# Patient Record
Sex: Female | Born: 1990
Health system: Southern US, Community
[De-identification: ages and names within clinical notes are randomized; demographics above are authoritative.]

## PROBLEM LIST (undated history)

## (undated) DIAGNOSIS — E282 Polycystic ovarian syndrome: Secondary | ICD-10-CM

---
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
## 2014-08-27 ENCOUNTER — Emergency Department (HOSPITAL_COMMUNITY): Payer: 59

## 2014-08-27 ENCOUNTER — Emergency Department (HOSPITAL_COMMUNITY)
Admission: EM | Admit: 2014-08-27 | Discharge: 2014-08-27 | Disposition: A | Discharge status: Home / Self Care | Payer: 59 | Attending: Emergency Medicine | Admitting: Emergency Medicine

## 2014-08-27 ENCOUNTER — Encounter (HOSPITAL_COMMUNITY): Payer: Self-pay | Admitting: *Deleted

## 2014-08-27 DIAGNOSIS — R630 Anorexia: Secondary | ICD-10-CM | POA: Insufficient documentation

## 2014-08-27 DIAGNOSIS — R1031 Right lower quadrant pain: Secondary | ICD-10-CM

## 2014-08-27 DIAGNOSIS — Z8639 Personal history of other endocrine, nutritional and metabolic disease: Secondary | ICD-10-CM | POA: Insufficient documentation

## 2014-08-27 DIAGNOSIS — K5900 Constipation, unspecified: Secondary | ICD-10-CM | POA: Insufficient documentation

## 2014-08-27 DIAGNOSIS — N898 Other specified noninflammatory disorders of vagina: Secondary | ICD-10-CM | POA: Diagnosis not present

## 2014-08-27 DIAGNOSIS — Z3202 Encounter for pregnancy test, result negative: Secondary | ICD-10-CM | POA: Insufficient documentation

## 2014-08-27 HISTORY — DX: Polycystic ovarian syndrome: E28.2

## 2014-08-27 LAB — LIPASE, BLOOD: LIPASE: 28 U/L (ref 11–59)

## 2014-08-27 LAB — COMPREHENSIVE METABOLIC PANEL
ALK PHOS: 53 U/L (ref 39–117)
ALT: 16 U/L (ref 0–35)
ANION GAP: 12 (ref 5–15)
AST: 19 U/L (ref 0–37)
Albumin: 4.3 g/dL (ref 3.5–5.2)
BILIRUBIN TOTAL: 0.6 mg/dL (ref 0.3–1.2)
BUN: 11 mg/dL (ref 6–23)
CHLORIDE: 100 meq/L (ref 96–112)
CO2: 25 meq/L (ref 19–32)
CREATININE: 0.74 mg/dL (ref 0.50–1.10)
Calcium: 9.4 mg/dL (ref 8.4–10.5)
GLUCOSE: 100 mg/dL — AB (ref 70–99)
POTASSIUM: 3.9 meq/L (ref 3.7–5.3)
Sodium: 137 mEq/L (ref 137–147)
Total Protein: 8.1 g/dL (ref 6.0–8.3)

## 2014-08-27 LAB — CBC WITH DIFFERENTIAL/PLATELET
BASOS PCT: 1 % (ref 0–1)
Basophils Absolute: 0.1 10*3/uL (ref 0.0–0.1)
Eosinophils Absolute: 0.1 10*3/uL (ref 0.0–0.7)
Eosinophils Relative: 1 % (ref 0–5)
HEMATOCRIT: 42.9 % (ref 36.0–46.0)
HEMOGLOBIN: 14.3 g/dL (ref 12.0–15.0)
LYMPHS ABS: 1.8 10*3/uL (ref 0.7–4.0)
Lymphocytes Relative: 25 % (ref 12–46)
MCH: 30.9 pg (ref 26.0–34.0)
MCHC: 33.3 g/dL (ref 30.0–36.0)
MCV: 92.7 fL (ref 78.0–100.0)
MONO ABS: 0.8 10*3/uL (ref 0.1–1.0)
MONOS PCT: 12 % (ref 3–12)
NEUTROS ABS: 4.5 10*3/uL (ref 1.7–7.7)
NEUTROS PCT: 61 % (ref 43–77)
Platelets: 232 10*3/uL (ref 150–400)
RBC: 4.63 MIL/uL (ref 3.87–5.11)
RDW: 12.8 % (ref 11.5–15.5)
WBC: 7.2 10*3/uL (ref 4.0–10.5)

## 2014-08-27 LAB — URINALYSIS, ROUTINE W REFLEX MICROSCOPIC
BILIRUBIN URINE: NEGATIVE
Glucose, UA: NEGATIVE mg/dL
Hgb urine dipstick: NEGATIVE
KETONES UR: NEGATIVE mg/dL
NITRITE: NEGATIVE
Protein, ur: NEGATIVE mg/dL
Specific Gravity, Urine: 1.012 (ref 1.005–1.030)
UROBILINOGEN UA: 0.2 mg/dL (ref 0.0–1.0)
pH: 7 (ref 5.0–8.0)

## 2014-08-27 LAB — WET PREP, GENITAL
Clue Cells Wet Prep HPF POC: NONE SEEN
Trich, Wet Prep: NONE SEEN
WBC, Wet Prep HPF POC: NONE SEEN
Yeast Wet Prep HPF POC: NONE SEEN

## 2014-08-27 LAB — URINE MICROSCOPIC-ADD ON

## 2014-08-27 LAB — POC URINE PREG, ED: Preg Test, Ur: NEGATIVE

## 2014-08-27 MED ORDER — POLYETHYLENE GLYCOL 3350 17 G PO PACK: 17.0000 g | PACK | Freq: Every day | ORAL | Status: AC

## 2014-08-27 MED ORDER — IOHEXOL 300 MG/ML  SOLN
25.0000 mL | INTRAMUSCULAR | Status: DC | PRN
  Administered 2014-08-27: 25 mL via ORAL
  Filled 2014-08-27: qty 30

## 2014-08-27 MED ORDER — SODIUM CHLORIDE 0.9 % IV BOLUS (SEPSIS)
1000.0000 mL | Freq: Once | INTRAVENOUS | Status: AC
  Administered 2014-08-27: 1000 mL via INTRAVENOUS

## 2014-08-27 MED ORDER — MORPHINE SULFATE 2 MG/ML IJ SOLN
2.0000 mg | Freq: Once | INTRAMUSCULAR | Status: DC
  Filled 2014-08-27: qty 1

## 2014-08-27 MED ORDER — IOHEXOL 300 MG/ML  SOLN
100.0000 mL | Freq: Once | INTRAMUSCULAR | Status: AC | PRN
  Administered 2014-08-27: 100 mL via INTRAVENOUS

## 2014-08-27 NOTE — ED Notes (Signed)
Pt wanting to hold off on pain medications.

## 2014-08-27 NOTE — Discharge Instructions (Signed)
Please call your doctor for a followup appointment within 24-48 hours. When you talk to your doctor please let them know that you were seen in the emergency department and have them acquire all of your records so that they can discuss the findings with you and formulate a treatment plan to fully care for your new and ongoing problems. Please call and set-up an appointment with your primary care provider to be seen and re-assessed  Please rest and stay hydrated Please increase fiber intake If pain continues within 24 hours please report back to the ED immediately  Please continue to monitor symptoms closely and if symptoms are to worsen or change (fever greater than 101, chills, sweating, nausea, vomiting, chest pain, shortness of breathe, difficulty breathing, weakness, numbness, tingling, worsening or changes to pain pattern, diarrhea, blood in the stools, black tarry stools, back pain, inability to keep food or fluids down) please report back to the Emergency Department immediately.   Constipation Constipation is when a person has fewer than three bowel movements a week, has difficulty having a bowel movement, or has stools that are dry, hard, or larger than normal. As people grow older, constipation is more common. If you try to fix constipation with medicines that make you have a bowel movement (laxatives), the problem may get worse. Long-term laxative use may cause the muscles of the colon to become weak. A low-fiber diet, not taking in enough fluids, and taking certain medicines may make constipation worse.  CAUSES   Certain medicines, such as antidepressants, pain medicine, iron supplements, antacids, and water pills.   Certain diseases, such as diabetes, irritable bowel syndrome (IBS), thyroid disease, or depression.   Not drinking enough water.   Not eating enough fiber-rich foods.   Stress or travel.   Lack of physical activity or exercise.   Ignoring the urge to have a bowel  movement.   Using laxatives too much.  SIGNS AND SYMPTOMS   Having fewer than three bowel movements a week.   Straining to have a bowel movement.   Having stools that are hard, dry, or larger than normal.   Feeling full or bloated.   Pain in the lower abdomen.   Not feeling relief after having a bowel movement.  DIAGNOSIS  Your health care provider will take a medical history and perform a physical exam. Further testing may be done for severe constipation. Some tests may include:  A barium enema X-ray to examine your rectum, colon, and, sometimes, your small intestine.   A sigmoidoscopy to examine your lower colon.   A colonoscopy to examine your entire colon. TREATMENT  Treatment will depend on the severity of your constipation and what is causing it. Some dietary treatments include drinking more fluids and eating more fiber-rich foods. Lifestyle treatments may include regular exercise. If these diet and lifestyle recommendations do not help, your health care provider may recommend taking over-the-counter laxative medicines to help you have bowel movements. Prescription medicines may be prescribed if over-the-counter medicines do not work.  HOME CARE INSTRUCTIONS   Eat foods that have a lot of fiber, such as fruits, vegetables, whole grains, and beans.  Limit foods high in fat and processed sugars, such as french fries, hamburgers, cookies, candies, and soda.   A fiber supplement may be added to your diet if you cannot get enough fiber from foods.   Drink enough fluids to keep your urine clear or pale yellow.   Exercise regularly or as directed by your health  care provider.   Go to the restroom when you have the urge to go. Do not hold it.   Only take over-the-counter or prescription medicines as directed by your health care provider. Do not take other medicines for constipation without talking to your health care provider first.  SEEK IMMEDIATE MEDICAL CARE  IF:   You have bright red blood in your stool.   Your constipation lasts for more than 4 days or gets worse.   You have abdominal or rectal pain.   You have thin, pencil-like stools.   You have unexplained weight loss. MAKE SURE YOU:   Understand these instructions.  Will watch your condition.  Will get help right away if you are not doing well or get worse. Document Released: 06/14/2004 Document Revised: 09/21/2013 Document Reviewed: 06/28/2013 [facility] Patient Information 2015 [place]. This information is not intended to replace advice given to you by your health care provider. Make sure you discuss any questions you have with your health care provider.  High-Fiber Diet Fiber is found in fruits, vegetables, and grains. A high-fiber diet encourages the addition of more whole grains, legumes, fruits, and vegetables in your diet. The recommended amount of fiber for adult males is 38 g per day. For adult females, it is 25 g per day. Pregnant and lactating women should get 28 g of fiber per day. If you have a digestive or bowel problem, ask your caregiver for advice before adding high-fiber foods to your diet. Eat a variety of high-fiber foods instead of only a select few type of foods.  PURPOSE  To increase stool bulk.  To make bowel movements more regular to prevent constipation.  To lower cholesterol.  To prevent overeating. WHEN IS THIS DIET USED?  It may be used if you have constipation and hemorrhoids.  It may be used if you have uncomplicated diverticulosis (intestine condition) and irritable bowel syndrome.  It may be used if you need help with weight management.  It may be used if you want to add it to your diet as a protective measure against atherosclerosis, diabetes, and cancer. SOURCES OF FIBER  Whole-grain breads and cereals.  Fruits, such as apples, oranges, bananas, berries, prunes, and pears.  Vegetables, such as green peas, carrots, sweet  potatoes, beets, broccoli, cabbage, spinach, and artichokes.  Legumes, such split peas, soy, lentils.  Almonds. FIBER CONTENT IN FOODS Starches and Grains / Dietary Fiber (g)  Cheerios, 1 cup / 3 g  Corn Flakes cereal, 1 cup / 0.7 g  Rice crispy treat cereal, 1 cup / 0.3 g  Instant oatmeal (cooked),  cup / 2 g  Frosted wheat cereal, 1 cup / 5.1 g  Brown, long-grain rice (cooked), 1 cup / 3.5 g  White, long-grain rice (cooked), 1 cup / 0.6 g  Enriched macaroni (cooked), 1 cup / 2.5 g Legumes / Dietary Fiber (g)  Baked beans (canned, plain, or vegetarian),  cup / 5.2 g  Kidney beans (canned),  cup / 6.8 g  Pinto beans (cooked),  cup / 5.5 g Breads and Crackers / Dietary Fiber (g)  Plain or honey graham crackers, 2 squares / 0.7 g  Saltine crackers, 3 squares / 0.3 g  Plain, salted pretzels, 10 pieces / 1.8 g  Whole-wheat bread, 1 slice / 1.9 g  White bread, 1 slice / 0.7 g  Raisin bread, 1 slice / 1.2 g  Plain bagel, 3 oz / 2 g  Flour tortilla, 1 oz / 0.9 g  Corn tortilla, 1 small / 1.5 g  Hamburger or hotdog bun, 1 small / 0.9 g Fruits / Dietary Fiber (g)  Apple with skin, 1 medium / 4.4 g  Sweetened applesauce,  cup / 1.5 g  Banana,  medium / 1.5 g  Grapes, 10 grapes / 0.4 g  Orange, 1 small / 2.3 g  Raisin, 1.5 oz / 1.6 g  Melon, 1 cup / 1.4 g Vegetables / Dietary Fiber (g)  Green beans (canned),  cup / 1.3 g  Carrots (cooked),  cup / 2.3 g  Broccoli (cooked),  cup / 2.8 g  Peas (cooked),  cup / 4.4 g  Mashed potatoes,  cup / 1.6 g  Lettuce, 1 cup / 0.5 g  Corn (canned),  cup / 1.6 g  Tomato,  cup / 1.1 g Document Released: 09/16/2005 Document Revised: 03/17/2012 Document Reviewed: 12/19/2011 ExitCare Patient Information 2015 [place], [place]. This information is not intended to replace advice given to you by your health care provider. Make sure you discuss any questions you have with your health care  provider.   Emergency Department Resource Guide 1) Find a Doctor and Pay Out of Pocket Although you won't have to find out who is covered by your insurance plan, it is a good idea to ask around and get recommendations. You will then need to call the office and see if the doctor you have chosen will accept you as a new patient and what types of options they offer for patients who are self-pay. Some doctors offer discounts or will set up payment plans for their patients who do not have insurance, but you will need to ask so you aren't surprised when you get to your appointment.  2) Contact Your Local Health Department Not all health departments have doctors that can see patients for sick visits, but many do, so it is worth a call to see if yours does. If you don't know where your local health department is, you can check in your phone book. The CDC also has a tool to help you locate your state's health department, and many state websites also have listings of all of their local health departments.  3) Find a Walk-in Clinic If your illness is not likely to be very severe or complicated, you may want to try a walk in clinic. These are popping up all over the country in pharmacies, drugstores, and shopping centers. They're usually staffed by nurse practitioners or physician assistants that have been trained to treat common illnesses and complaints. They're usually fairly quick and inexpensive. However, if you have serious medical issues or chronic medical problems, these are probably not your best option.  No Primary Care Doctor: - Call Health Connect at  [phone] - they can help you locate a primary care doctor that  accepts your insurance, provides certain services, etc. - Physician Referral Service- [phone]  Chronic Pain Problems: Organization         Address  Phone   Notes  [facility]  [phone] Patients need to be referred by their primary care doctor.    Medication Assistance: Organization         Address  Phone   Notes  [facility] Med[facility] [address] [place] [place], [place] [address] [phone] --Must be a resident of [facility] -- Must have NO insurance coverage whatsoever (no Medicaid/ Medicare, etc.) -- The pt. MUST have a primary care doctor that directs their  care regularly and follows them in the community   [facility]  [phone]   [employer]  [phone]    Agencies that provide inexpensive medical care: Organization         Address  Phone   Notes  [facility]  [phone]   [facility]    [phone]   [facility] [address] [place], [place] [address] [phone]   [facility] [address] [place], [place] [phone]   Planned Parenthood    [phone]   [facility]    [phone]   [facility]  [address], [place] Phone:  [phone], Fax:  [phone] Hours of Operation:  9 am - 6 pm, M-F.  Also accepts Medicaid/Medicare and self-pay.  [facility]  [address], [place] Phone: [phone], Fax: [phone]. Hours of Operation:  8:30 am - 5:30 pm, M-F.  Also accepts Medicaid and self-pay.  [facility] [place] [address] [place] Phone: [phone]   [facility] [address] Natasha Bence [place], [place] [phone], Ext. [phone] Mondays & Thursdays: 7-9 AM.  First 15 patients are seen on a first come, first serve basis.    Medicaid-accepting [facility] Providers:  Organization         Address  Phone   Notes  [facility] [address], [place] [phone] Also accepts self-pay patients.  [facility] [address] Laurell Josephs [place], [place]  [phone]   [facility] [address], [place] [phone]   [facility] [address] [place] [phone]   Renaye Rakers [address], [place]   [phone] Only accepts [place] Access [place] patients after they have their name applied to their card.   Self-Pay (no insurance) in [facility]:  Organization         Address  Phone   Notes  Sickle Cell Patients, [facility] Medicine [address] [place], [place] [phone]   [facility] [address] [place], [place] [phone]   [facility] [place]  [address], [place] [phone]   [facility]/Dr. Osei-Bonsu  [address] [place] or [address], [place] [phone] Phone number for both Cash and Time locations is the same.  [facility] [address], [place] [phone]   [facility] [address] [place] or [address] [phone]   [facility] [address] [place] [phone], phone; [phone], fax Sees patients 1st and 3rd Saturday of every month.  Must not qualify for public or private insurance (i.e. Medicaid, Medicare, [place] Health Choice, Veterans' Benefits)  Household income should be no more than 200% of the poverty level The clinic cannot treat you if you are pregnant or think you are pregnant  Sexually transmitted diseases are not treated at the clinic.    Dental Care: Organization         Address  Phone  Notes  [facility] [address] [place], [place] [phone] Accepts children up to age 24  who are enrolled in Medicaid or [place] Health Choice; pregnant women with a Medicaid card; and children who have applied for Medicaid or [place] Health Choice, but were declined, whose parents can pay a reduced fee at time of service.   [facility] [place]  [address], [place] [phone] Accepts children up to age 25 who are enrolled in [place] or [place] Health Choice; pregnant women with a Medicaid card; and children who have applied for Medicaid or [place] Health Choice, but were declined, whose parents can pay a reduced fee at time of service.  [facility]  [address], [place] [phone] Patients are seen by appointment only. Walk-ins are not accepted. [facility] will see patients 25 years of age and older. Monday - Tuesday (8am-5pm) Most Wednesdays (8:30-5pm) $30 per visit, cash only  [facility]  [address], [facility] [place] [phone] Patients are seen by appointment only. Walk-ins are not accepted. [facility] will see patients 25 years of age and older. One Wednesday Evening (Monthly: Volunteer Based).  $30 per visit, cash only  [employer] [facility]  [phone] for adults; Children under age 214, call Graduate Pediatric Dentistry at [phone]) [phone]. Children aged 284-14, please call [phone] to request a pediatric application.  Dental services are provided in all areas of dental care including fillings, crowns and bridges, complete and partial dentures, implants, gum treatment, root canals, and extractions. Preventive care is also provided. Treatment is provided to both adults and children. Patients are selected via a lottery and there is often a waiting list.   [facility] [address], [place]  [phone] [email or web address]   [facility] [address], [place], [place] [phone], Ext. [phone] Second and Fourth Thursday of each month, opens at 6:30 AM; Clinic ends at 9 AM.  Patients are seen on a first-come first-served basis, and a limited number are seen during each clinic.   [facility]  [address], [place], [place] [phone]  [phone]   Eligibility Requirements You must have lived in [place], [place], or [place] counties for at least the last three months.   You cannot be eligible for state or federal sponsored [place] Cityhealthcare insurance, including CIGNAVeterans Administration, [place]icaid, or [employer] EntertainmentMedicare.   You generally cannot be eligible for healthcare insurance through your employer.    How to apply: Eligibility screenings are held every Tuesday and Wednesday afternoon from 1:00 pm until 4:00 pm. You do not need an appointment for the interview!  [facility] [address], [place], [place] [phone]   [facility]  [phone]   [facility]  [phone]   [facility]  [phone]    Behavioral Health Resources in the Community: Intensive Outpatient Programs Organization         Address  Phone  Notes  [facility] [address], [place], [place] [phone]   [facility] Outpatient [address], [place], [place] [phone]   [facility] [address], [place], [place]  [phone]   [facility] [address],  [place], [place] [phone] or [phone]   Substance Abuse Resources Organization         Address  Phone  Notes  [facility]  [phone]   [facility]  [phone]   [facility]  [phone]   Floydene Flock  [phone]   Residential & Outpatient Substance Abuse Program  [phone]   Psychological Services Organization         Address  Phone  Notes  [facility]  [phone]   [facility]  [phone]   [facility] Mental Health [address] [place] [phone] or [phone]    Mobile Crisis Teams Organization         Address  Phone  Notes  [facility], Mobile Crisis Care Unit  [phone]   [facility]  [address] [place], [place] [phone]   Doristine Locks [address] [place] [phone]    Self-Help/Support Groups Organization         Address  Phone             Notes  [facility] [place] - variety of support groups  [phone]- [phone] Call for more information  Narcotics Anonymous (NA), [facility] [address], [employer] [place]  2 meetings at this location   Statistician         Address  Phone  Notes  [facility] [address],    [place]  [phone]   [facility]  [address] [place] [address], [place], [place] [phone]   [facility] [address] [place], [place] [phone] Admissions: 8am-3pm M-F  [facility] [address],    [place], [place] [phone]   [facility] [address] [place], [place], [place] [phone]   [facility] [address]  [place], [place] [phone]   [facility] - Intensive Outpatient [address] Laurell Josephs [address], [place], [place] [phone]   [facility] [place] [facility] [place] ([facility] [address] [place].,  [place], [place] [phone] or [phone]   [facility] ([facility]) [address] [place], [place] [phone] Accepts Medicaid  [facility] [address],  [place] [phone] Substance Abuse/Addiction Treatment   [facility] Organization         Address  Phone  Notes  [facility]  [phone]   Angie Fava, PhD [address] Ervin Knack [place], [place]   [phone] or [phone]   [facility] Behavioral   [address] [place], [place] [phone]   [facility] [address], [place], [place] [phone] Insurance/Medicaid/sponsorship through [facility] [address]                                    [place], [place] [phone]  Therapy/tele-psych/case  [facility] [address], [place] [phone]    Dr. Lolly Mustache  [phone]   [facility]  [employer] [facility] Dept. 1) [address], [place] 2) [address] [place] 3)  [address], [place] [phone]  [phone]   [facility] Child Abuse Hotline [phone] or [phone] (After Hours)

## 2014-08-27 NOTE — ED Notes (Signed)
Pt started having LRQ THursday night while at work. 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 Denies fever, vomiting. C/o nausea. Pain is 8/10.

## 2014-08-27 NOTE — ED Provider Notes (Signed)
CSN: [id number]     Arrival date & time 08/27/14  1300 History   First MD Initiated Contact with Patient 08/27/14 1325     Chief Complaint  Patient presents with  . Abdominal Pain     (Consider location/radiation/quality/duration/timing/severity/associated sxs/prior Treatment) The history is provided by the patient. No language interpreter was used.  Ann Howard is a 23 y.o F with PMHx of PCOS presenting to the ED with right lower quadrant abdominal pain that started on Thursday described as a sharp pain that is constant and progressively worse over the past couple of days. Patient reports that the pain stays the right lower quadrant without radiation. Reported that she does have history of PCOS, but states that this pain is very different and more intense. Reported nausea and decreased appetite. Denied fever, diarrhea, vomiting, chills, dysuria, changes to bowel movements, melena, hematochezia, decreased passing of gas, vaginal discharge, vaginal pain, abnormal vaginal bleeding. PCP Dr. Rosemary Holmsavon  Past Medical History  Diagnosis Date  . Polycystic disease, ovaries    No past surgical history on file. No family history on file. History  Substance Use Topics  . Smoking status: Not on file  . Smokeless tobacco: Not on file  . Alcohol Use: Not on file   OB History    No data available     Review of Systems  Constitutional: Negative for fever and chills.  Respiratory: Negative for chest tightness and shortness of breath.   Cardiovascular: Negative for chest pain.  Gastrointestinal: Positive for nausea and abdominal pain. Negative for vomiting, diarrhea, constipation, blood in stool and anal bleeding.  Neurological: Negative for dizziness, weakness and headaches.      Allergies  Articaine  Home Medications   Prior to Admission medications   Medication Sig Start Date End Date Taking? Authorizing Provider  polyethylene glycol (MIRALAX / GLYCOLAX) packet Take 17 g by mouth  daily. 08/27/14   Khori Rosevear, PA-C   BP 124/77 mmHg  Pulse 77  Temp(Src) 98 F (36.7 C) (Oral)  Resp 16  Ht 5\' 11"  (1.803 m)  Wt 150 lb (68.04 kg)  BMI 20.93 kg/m2  SpO2 100%  LMP 08/06/2014 (Approximate) Physical Exam  Constitutional: She is oriented to person, place, and time. She appears well-developed and well-nourished. No distress.  HENT:  Head: Normocephalic and atraumatic.  Eyes: Conjunctivae and EOM are normal. Pupils are equal, round, and reactive to light. Right eye exhibits no discharge. Left eye exhibits no discharge.  Neck: Normal range of motion. Neck supple. No tracheal deviation present.  Cardiovascular: Normal rate, regular rhythm and normal heart sounds.   Pulmonary/Chest: Effort normal and breath sounds normal. No respiratory distress. She has no wheezes. She has no rales.  Abdominal: Soft. Bowel sounds are normal. She exhibits no distension. There is tenderness in the right lower quadrant. There is rebound. There is no guarding.    Negative abdominal distention Bowel sounds normal active in all 4 quadrants Abdomen soft upon palpation Discomfort upon palpation to the right lower quadrant Negative Rovsing sign Negative psoas and obturator Positive rebound upon palpation to the right lower quadrant  Genitourinary: Vaginal discharge found.  Pelvic Exam: Negative swelling, erythema, inflammation, lesions, sores, abnormalities noted to the external genitalia. Negative abnormalities noted to the vaginal canal. Negative blood in the vaginal vault. Mild thick white discharge noted on exam with negative odor. Negative friability of the cervix. Negative CMT and adnexal tenderness noted bilaterally.  Exam chaperoned with nurse, Alcario DroughtErica  Musculoskeletal: Normal range of motion.  Lymphadenopathy:    She has no cervical adenopathy.  Neurological: She is alert and oriented to person, place, and time. No cranial nerve deficit. She exhibits normal muscle tone. Coordination  normal.  Skin: Skin is warm and dry. No rash noted. She is not diaphoretic. No erythema.  Psychiatric: She has a normal mood and affect. Her behavior is normal. Thought content normal.  Nursing note and vitals reviewed.   ED Course  Procedures (including critical care time)  Results for orders placed or performed during the hospital encounter of 08/27/14  Wet prep, genital  Result Value Ref Range   Yeast Wet Prep HPF POC NONE SEEN NONE SEEN   Trich, Wet Prep NONE SEEN NONE SEEN   Clue Cells Wet Prep HPF POC NONE SEEN NONE SEEN   WBC, Wet Prep HPF POC NONE SEEN NONE SEEN  CBC with Differential  Result Value Ref Range   WBC 7.2 4.0 - 10.5 K/uL   RBC 4.63 3.87 - 5.11 MIL/uL   Hemoglobin 14.3 12.0 - 15.0 g/dL   HCT 16.1 09.6 - 04.5 %   MCV 92.7 78.0 - 100.0 fL   MCH 30.9 26.0 - 34.0 pg   MCHC 33.3 30.0 - 36.0 g/dL   RDW 40.9 81.1 - 91.4 %   Platelets 232 150 - 400 K/uL   Neutrophils Relative % 61 43 - 77 %   Neutro Abs 4.5 1.7 - 7.7 K/uL   Lymphocytes Relative 25 12 - 46 %   Lymphs Abs 1.8 0.7 - 4.0 K/uL   Monocytes Relative 12 3 - 12 %   Monocytes Absolute 0.8 0.1 - 1.0 K/uL   Eosinophils Relative 1 0 - 5 %   Eosinophils Absolute 0.1 0.0 - 0.7 K/uL   Basophils Relative 1 0 - 1 %   Basophils Absolute 0.1 0.0 - 0.1 K/uL  Comprehensive metabolic panel  Result Value Ref Range   Sodium 137 137 - 147 mEq/L   Potassium 3.9 3.7 - 5.3 mEq/L   Chloride 100 96 - 112 mEq/L   CO2 25 19 - 32 mEq/L   Glucose, Bld 100 (H) 70 - 99 mg/dL   BUN 11 6 - 23 mg/dL   Creatinine, Ser 7.82 0.50 - 1.10 mg/dL   Calcium 9.4 8.4 - 95.6 mg/dL   Total Protein 8.1 6.0 - 8.3 g/dL   Albumin 4.3 3.5 - 5.2 g/dL   AST 19 0 - 37 U/L   ALT 16 0 - 35 U/L   Alkaline Phosphatase 53 39 - 117 U/L   Total Bilirubin 0.6 0.3 - 1.2 mg/dL   GFR calc non Af Amer >90 >90 mL/min   GFR calc Af Amer >90 >90 mL/min   Anion gap 12 5 - 15  Lipase, blood  Result Value Ref Range   Lipase 28 11 - 59 U/L  Urinalysis,  Routine w reflex microscopic  Result Value Ref Range   Color, Urine YELLOW YELLOW   APPearance CLOUDY (A) CLEAR   Specific Gravity, Urine 1.012 1.005 - 1.030   pH 7.0 5.0 - 8.0   Glucose, UA NEGATIVE NEGATIVE mg/dL   Hgb urine dipstick NEGATIVE NEGATIVE   Bilirubin Urine NEGATIVE NEGATIVE   Ketones, ur NEGATIVE NEGATIVE mg/dL   Protein, ur NEGATIVE NEGATIVE mg/dL   Urobilinogen, UA 0.2 0.0 - 1.0 mg/dL   Nitrite NEGATIVE NEGATIVE   Leukocytes, UA TRACE (A) NEGATIVE  Urine microscopic-add on  Result Value Ref Range   Squamous Epithelial / LPF MANY (A)  RARE   WBC, UA 7-10 <3 WBC/hpf   Bacteria, UA FEW (A) RARE  POC urine preg, ED (not [facility])  Result Value Ref Range   Preg Test, Ur NEGATIVE NEGATIVE    Labs Review Labs Reviewed  COMPREHENSIVE METABOLIC PANEL - Abnormal; Notable for the following:    Glucose, Bld 100 (*)    All other components within normal limits  URINALYSIS, ROUTINE W REFLEX MICROSCOPIC - Abnormal; Notable for the following:    APPearance CLOUDY (*)    Leukocytes, UA TRACE (*)    All other components within normal limits  URINE MICROSCOPIC-ADD ON - Abnormal; Notable for the following:    Squamous Epithelial / LPF MANY (*)    Bacteria, UA FEW (*)    All other components within normal limits  WET PREP, GENITAL  GC/CHLAMYDIA PROBE AMP  URINE CULTURE  CBC WITH DIFFERENTIAL  LIPASE, BLOOD  HIV ANTIBODY (ROUTINE TESTING)  POC URINE PREG, ED    Imaging Review [place] Transvaginal Non-ob  08/27/2014   CLINICAL DATA:  Right lower quadrant pain.  EXAM: TRANSABDOMINAL AND TRANSVAGINAL ULTRASOUND OF PELVIS  DOPPLER ULTRASOUND OF OVARIES  TECHNIQUE: Both transabdominal and transvaginal ultrasound examinations of the pelvis were performed. Transabdominal technique was performed for global imaging of the pelvis including uterus, ovaries, adnexal regions, and pelvic cul-de-sac.  It was necessary to proceed with endovaginal exam following the transabdominal exam to  visualize the ovaries and adnexa. Color and duplex Doppler ultrasound was utilized to evaluate blood flow to the ovaries.  COMPARISON:  CT scan dated 08/27/2014  FINDINGS: Uterus  Measurements: 6.0 x 3.4 x 4.7 cm. No fibroids or other mass visualized.  Endometrium  Thickness: 10.8 mm.  No focal abnormality visualized.  Right ovary  Measurements: 3.8 x 2.7 x 2.3 cm. Normal appearance/no adnexal mass. Normal follicles.  Left ovary  Measurements: 2.5 x 2.1 x 1.8 cm. Normal appearance/no adnexal mass. Normal follicles.  Pulsed Doppler evaluation of both ovaries demonstrates normal low-resistance arterial and venous waveforms.  Other findings  Trace free fluid in the pelvis.  IMPRESSION: Normal exam.  Normal appearing ovaries.   Electronically Signed   By: Geanie CooleyJim  Maxwell M.D.   On: 08/27/2014 17:10   [place] Pelvis Complete  08/27/2014   CLINICAL DATA:  Right lower quadrant pain.  EXAM: TRANSABDOMINAL AND TRANSVAGINAL ULTRASOUND OF PELVIS  DOPPLER ULTRASOUND OF OVARIES  TECHNIQUE: Both transabdominal and transvaginal ultrasound examinations of the pelvis were performed. Transabdominal technique was performed for global imaging of the pelvis including uterus, ovaries, adnexal regions, and pelvic cul-de-sac.  It was necessary to proceed with endovaginal exam following the transabdominal exam to visualize the ovaries and adnexa. Color and duplex Doppler ultrasound was utilized to evaluate blood flow to the ovaries.  COMPARISON:  CT scan dated 08/27/2014  FINDINGS: Uterus  Measurements: 6.0 x 3.4 x 4.7 cm. No fibroids or other mass visualized.  Endometrium  Thickness: 10.8 mm.  No focal abnormality visualized.  Right ovary  Measurements: 3.8 x 2.7 x 2.3 cm. Normal appearance/no adnexal mass. Normal follicles.  Left ovary  Measurements: 2.5 x 2.1 x 1.8 cm. Normal appearance/no adnexal mass. Normal follicles.  Pulsed Doppler evaluation of both ovaries demonstrates normal low-resistance arterial and venous waveforms.  Other  findings  Trace free fluid in the pelvis.  IMPRESSION: Normal exam.  Normal appearing ovaries.   Electronically Signed   By: Geanie CooleyJim  Maxwell M.D.   On: 08/27/2014 17:10   Ct Abdomen Pelvis W Contrast  08/27/2014  CLINICAL DATA:  Abdominal pain, right lower quadrant pain since Thursday  EXAM: CT ABDOMEN AND PELVIS WITH CONTRAST  TECHNIQUE: Multidetector CT imaging of the abdomen and pelvis was performed using the standard protocol following bolus administration of intravenous contrast.  CONTRAST:  OMNIPAQUE IOHEXOL 300 MG/ML  SOLN  COMPARISON:  None.  FINDINGS: Lung bases are unremarkable. Sagittal images of the spine are unremarkable.  Enhanced liver is unremarkable. The pancreas, spleen and adrenal glands are unremarkable. Abdominal aorta is unremarkable.  No small bowel obstruction. Abundant stool noted in right colon and transverse colon. There is no pericecal inflammation. There is a low lying cecum. The tip of the cecum is in right lower anterior pelvis just lateral to urinary bladder. Normal appendix retrocecal position clearly visualized axial image 71. Small amount of nonspecific fluid is noted within uterus. No adnexal mass is noted. Significant stool noted in sigmoid colon and rectum. The rectum is distended with stool up to 6.5 cm in diameter suspicious for mild fecal impaction.  Kidneys are symmetrical in size and enhancement. No hydronephrosis or hydroureter. No destructive bony lesions are noted within pelvis. No inguinal adenopathy.  IMPRESSION: 1. No pericecal inflammation. Abundant stool noted in right colon and transverse colon. There is a low lying cecum. Normal appendix clearly visualized. 2. No hydronephrosis or hydroureter. 3. Significant stool noted within rectum measures 6.5 cm in diameter suspicious for mild fecal impaction. 4. No adnexal mass. 5. No small bowel obstruction.   Electronically Signed   By: Natasha Mead M.D.   On: 08/27/2014 15:34   [place] Art/ven Flow Abd Pelv  Doppler  08/27/2014   CLINICAL DATA:  Right lower quadrant pain.  EXAM: TRANSABDOMINAL AND TRANSVAGINAL ULTRASOUND OF PELVIS  DOPPLER ULTRASOUND OF OVARIES  TECHNIQUE: Both transabdominal and transvaginal ultrasound examinations of the pelvis were performed. Transabdominal technique was performed for global imaging of the pelvis including uterus, ovaries, adnexal regions, and pelvic cul-de-sac.  It was necessary to proceed with endovaginal exam following the transabdominal exam to visualize the ovaries and adnexa. Color and duplex Doppler ultrasound was utilized to evaluate blood flow to the ovaries.  COMPARISON:  CT scan dated 08/27/2014  FINDINGS: Uterus  Measurements: 6.0 x 3.4 x 4.7 cm. No fibroids or other mass visualized.  Endometrium  Thickness: 10.8 mm.  No focal abnormality visualized.  Right ovary  Measurements: 3.8 x 2.7 x 2.3 cm. Normal appearance/no adnexal mass. Normal follicles.  Left ovary  Measurements: 2.5 x 2.1 x 1.8 cm. Normal appearance/no adnexal mass. Normal follicles.  Pulsed Doppler evaluation of both ovaries demonstrates normal low-resistance arterial and venous waveforms.  Other findings  Trace free fluid in the pelvis.  IMPRESSION: Normal exam.  Normal appearing ovaries.   Electronically Signed   By: Geanie Cooley M.D.   On: 08/27/2014 17:10     EKG Interpretation None      MDM   Final diagnoses:  RLQ abdominal pain  Constipation, unspecified constipation type    Medications  morphine 2 MG/ML injection 2 mg (not administered)  iohexol (OMNIPAQUE) 300 MG/ML solution 25 mL (25 mLs Oral Contrast Given 08/27/14 1438)  sodium chloride 0.9 % bolus 1,000 mL (1,000 mLs Intravenous New Bag/Given 08/27/14 1445)  iohexol (OMNIPAQUE) 300 MG/ML solution 100 mL (100 mLs Intravenous Contrast Given 08/27/14 1452)   Filed Vitals:   08/27/14 1415 08/27/14 1449 08/27/14 1530 08/27/14 1743  BP: 116/71 91/71 121/65 124/77  Pulse: 72 115 77 77  Temp:    98 F (36.7  C)  TempSrc:     Oral  Resp:  16    Height:      Weight:      SpO2: 100% 100% 100% 100%   CBC negative elevated white blood cell count. CMP unremarkable. Lipase negative elevation. Urinalysis negative for hemoglobin, nitrites. Trace leukocytes identified what blood cell count 7-10-many squamous cells noted on specimen appears to be contaminated. Urine culture pending. Urine pregnancy negative. Wet prep unremarkable. GC Chlamydia probe and HIV pending. CT abdomen and pelvis with contrast noted no pericecal inflammation, abundant stool in the right colon and transverse colon. Normal appendix identified. Significant stool noted within the rectum measures 6.5 centimeters in diameter suspicious for mild fecal impaction. No signs of adnexal masses or small bowel obstruction. Pelvic ultrasound normal exam, normal appearing ovaries. Negative findings of appendicitis. Doubt pancreatitis. Negative findings of acute abdominal processes. Doubt ovarian torsion. Doubt TOA. Doubt PID or acute pelvic inflammatory illnesses. Suspicion to be constipation with mild fecal impaction noted on CT. Discussed labs and imaging great detail with patient. Patient stable, afebrile. Patient not septic appearing. Discharged patient. Discharged patient with stool softeners. Discussed with patient to increase hydration and fiber intake. Discussed with patient that if symptoms are to worsen within 24 hours to report back to the ED immediately. Discussed with patient to closely monitor symptoms and if symptoms are to worsen or change to report back to the ED - strict return instructions given.  Patient agreed to plan of care, understood, all questions answered.   Raymon Mutton, PA-C 08/27/14 1825  Arby Barrette, MD 08/30/14 [phone]

## 2014-08-28 LAB — URINE CULTURE: SPECIAL REQUESTS: NORMAL

## 2014-08-28 LAB — HIV ANTIBODY (ROUTINE TESTING W REFLEX): HIV 1&2 Ab, 4th Generation: NONREACTIVE

## 2014-08-29 ENCOUNTER — Emergency Department (HOSPITAL_COMMUNITY): Payer: 59

## 2014-08-29 ENCOUNTER — Emergency Department (HOSPITAL_COMMUNITY)
Admission: EM | Admit: 2014-08-29 | Discharge: 2014-08-29 | Disposition: A | Discharge status: Home / Self Care | Payer: 59 | Attending: Emergency Medicine | Admitting: Emergency Medicine

## 2014-08-29 ENCOUNTER — Encounter (HOSPITAL_COMMUNITY): Payer: Self-pay | Admitting: Family Medicine

## 2014-08-29 DIAGNOSIS — K5900 Constipation, unspecified: Secondary | ICD-10-CM | POA: Diagnosis not present

## 2014-08-29 DIAGNOSIS — R1031 Right lower quadrant pain: Secondary | ICD-10-CM

## 2014-08-29 DIAGNOSIS — Z79899 Other long term (current) drug therapy: Secondary | ICD-10-CM | POA: Diagnosis not present

## 2014-08-29 DIAGNOSIS — R110 Nausea: Secondary | ICD-10-CM | POA: Insufficient documentation

## 2014-08-29 DIAGNOSIS — Z3202 Encounter for pregnancy test, result negative: Secondary | ICD-10-CM | POA: Insufficient documentation

## 2014-08-29 DIAGNOSIS — Z8639 Personal history of other endocrine, nutritional and metabolic disease: Secondary | ICD-10-CM | POA: Diagnosis not present

## 2014-08-29 LAB — CBC WITH DIFFERENTIAL/PLATELET
Basophils Absolute: 0 10*3/uL (ref 0.0–0.1)
Basophils Relative: 0 % (ref 0–1)
Eosinophils Absolute: 0 10*3/uL (ref 0.0–0.7)
Eosinophils Relative: 0 % (ref 0–5)
HCT: 39.5 % (ref 36.0–46.0)
Hemoglobin: 13.1 g/dL (ref 12.0–15.0)
Lymphocytes Relative: 13 % (ref 12–46)
Lymphs Abs: 0.9 10*3/uL (ref 0.7–4.0)
MCH: 30.7 pg (ref 26.0–34.0)
MCHC: 33.2 g/dL (ref 30.0–36.0)
MCV: 92.5 fL (ref 78.0–100.0)
Monocytes Absolute: 0.9 10*3/uL (ref 0.1–1.0)
Monocytes Relative: 12 % (ref 3–12)
Neutro Abs: 5.2 10*3/uL (ref 1.7–7.7)
Neutrophils Relative %: 75 % (ref 43–77)
Platelets: 182 10*3/uL (ref 150–400)
RBC: 4.27 MIL/uL (ref 3.87–5.11)
RDW: 12.8 % (ref 11.5–15.5)
WBC: 7.1 10*3/uL (ref 4.0–10.5)

## 2014-08-29 LAB — URINALYSIS, ROUTINE W REFLEX MICROSCOPIC
Bilirubin Urine: NEGATIVE
Glucose, UA: NEGATIVE mg/dL
Hgb urine dipstick: NEGATIVE
Ketones, ur: NEGATIVE mg/dL
Leukocytes, UA: NEGATIVE
Nitrite: NEGATIVE
Protein, ur: NEGATIVE mg/dL
Specific Gravity, Urine: 1.007 (ref 1.005–1.030)
Urobilinogen, UA: 0.2 mg/dL (ref 0.0–1.0)
pH: 7.5 (ref 5.0–8.0)

## 2014-08-29 LAB — COMPREHENSIVE METABOLIC PANEL
ALT: 14 U/L (ref 0–35)
AST: 19 U/L (ref 0–37)
Albumin: 3.7 g/dL (ref 3.5–5.2)
Alkaline Phosphatase: 49 U/L (ref 39–117)
Anion gap: 13 (ref 5–15)
BUN: 11 mg/dL (ref 6–23)
CO2: 25 mEq/L (ref 19–32)
Calcium: 9 mg/dL (ref 8.4–10.5)
Chloride: 102 mEq/L (ref 96–112)
Creatinine, Ser: 0.76 mg/dL (ref 0.50–1.10)
GFR calc Af Amer: >90 mL/min (ref >90)
GFR calc non Af Amer: >90 mL/min (ref >90)
Glucose, Bld: 86 mg/dL (ref 70–99)
Potassium: 3.7 mEq/L (ref 3.7–5.3)
Sodium: 140 mEq/L (ref 137–147)
Total Bilirubin: 0.5 mg/dL (ref 0.3–1.2)
Total Protein: 7.2 g/dL (ref 6.0–8.3)

## 2014-08-29 LAB — POC URINE PREG, ED: Preg Test, Ur: NEGATIVE

## 2014-08-29 LAB — LIPASE, BLOOD: Lipase: 24 U/L (ref 11–59)

## 2014-08-29 MED ORDER — IOHEXOL 300 MG/ML  SOLN
100.0000 mL | Freq: Once | INTRAMUSCULAR | Status: AC | PRN
  Administered 2014-08-29: 100 mL via INTRAVENOUS

## 2014-08-29 MED ORDER — HYDROCODONE-ACETAMINOPHEN 5-325 MG PO TABS: 1.0000 | ORAL_TABLET | ORAL | Status: DC | PRN

## 2014-08-29 MED ORDER — AMOXICILLIN-POT CLAVULANATE 500-125 MG PO TABS: 1.0000 | ORAL_TABLET | Freq: Three times a day (TID) | ORAL | Status: DC

## 2014-08-29 MED ORDER — FLUCONAZOLE 150 MG PO TABS: 150.0000 mg | ORAL_TABLET | Freq: Every day | ORAL | Status: AC

## 2014-08-29 MED ORDER — IOHEXOL 300 MG/ML  SOLN
25.0000 mL | Freq: Once | INTRAMUSCULAR | Status: AC | PRN
  Administered 2014-08-29: 25 mL via ORAL

## 2014-08-29 NOTE — ED Notes (Signed)
Pt sts was seed here 2 days ago and wtill having RLQ pain. sts some nausea. sts unable to sleep.

## 2014-08-29 NOTE — ED Notes (Addendum)
Patient transported to Ultrasound 

## 2014-08-29 NOTE — Discharge Instructions (Signed)
Read the information below.  Use the prescribed medication as directed.  Please discuss all new medications with your pharmacist.  Do not take additional tylenol while taking the prescribed pain medication to avoid overdose.  You may return to the Emergency Department at any time for worsening condition or any new symptoms that concern you.  If you develop high fevers, worsening abdominal pain, uncontrolled vomiting, or are unable to tolerate fluids by mouth, return to the ER for a recheck.   ° ° °Abdominal Pain °Many things can cause abdominal pain. Usually, abdominal pain is not caused by a disease and will improve without treatment. It can often be observed and treated at home. Your health care provider will do a physical exam and possibly order blood tests and X-rays to help determine the seriousness of your pain. However, in many cases, more time must pass before a clear cause of the pain can be found. Before that point, your health care provider may not know if you need more testing or further treatment. °HOME CARE INSTRUCTIONS  °Monitor your abdominal pain for any changes. The following actions may help to alleviate any discomfort you are experiencing: °· Only take over-the-counter or prescription medicines as directed by your health care provider. °· Do not take laxatives unless directed to do so by your health care provider. °· Try a clear liquid diet (broth, tea, or water) as directed by your health care provider. Slowly move to a bland diet as tolerated. °SEEK MEDICAL CARE IF: °· You have unexplained abdominal pain. °· You have abdominal pain associated with nausea or diarrhea. °· You have pain when you urinate or have a bowel movement. °· You experience abdominal pain that wakes you in the night. °· You have abdominal pain that is worsened or improved by eating food. °· You have abdominal pain that is worsened with eating fatty foods. °· You have a fever. °SEEK IMMEDIATE MEDICAL CARE IF:  °· Your pain  does not go away within 2 hours. °· You keep throwing up (vomiting). °· Your pain is felt only in portions of the abdomen, such as the right side or the left lower portion of the abdomen. °· You pass bloody or black tarry stools. °MAKE SURE YOU: °· Understand these instructions.   °· Will watch your condition.   °· Will get help right away if you are not doing well or get worse.   °Document Released: 06/26/2005 Document Revised: 09/21/2013 Document Reviewed: 05/26/2013 °Exit[employer]® Patient Information ©2015 [employer]. This information is not intended to replace advice given to you by your health care provider. Make sure you discuss any questions you have with your health care provider. ° °Constipation °Constipation is when a person has fewer than three bowel movements a week, has difficulty having a bowel movement, or has stools that are dry, hard, or larger than normal. As people grow older, constipation is more common. If you try to fix constipation with medicines that make you have a bowel movement (laxatives), the problem may get worse. Long-term laxative use may cause the muscles of the colon to become weak. A low-fiber diet, not taking in enough fluids, and taking certain medicines may make constipation worse.  °CAUSES  °· Certain medicines, such as antidepressants, pain medicine, iron supplements, antacids, and water pills.   °· Certain diseases, such as diabetes, irritable bowel syndrome (IBS), thyroid disease, or depression.   °· Not drinking enough water.   °· Not eating enough fiber-rich foods.   °· Stress or travel.   °· Lack of   physical activity or exercise.   °· Ignoring the urge to have a bowel movement.   °· Using laxatives too much.   °SIGNS AND SYMPTOMS  °· Having fewer than three bowel movements a week.   °· Straining to have a bowel movement.   °· Having stools that are hard, dry, or larger than normal.   °· Feeling full or bloated.   °· Pain in the lower abdomen.   °· Not feeling relief after  having a bowel movement.   °DIAGNOSIS  °Your health care provider will take a medical history and perform a physical exam. Further testing may be done for severe constipation. Some tests may include: °· A barium enema X-ray to examine your rectum, colon, and, sometimes, your small intestine.   °· A sigmoidoscopy to examine your lower colon.   °· A colonoscopy to examine your entire colon. °TREATMENT  °Treatment will depend on the severity of your constipation and what is causing it. Some dietary treatments include drinking more fluids and eating more fiber-rich foods. Lifestyle treatments may include regular exercise. If these diet and lifestyle recommendations do not help, your health care provider may recommend taking over-the-counter laxative medicines to help you have bowel movements. Prescription medicines may be prescribed if over-the-counter medicines do not work.  °HOME CARE INSTRUCTIONS  °· Eat foods that have a lot of fiber, such as fruits, vegetables, whole grains, and beans. °· Limit foods high in fat and processed sugars, such as french fries, hamburgers, cookies, candies, and soda.   °· A fiber supplement may be added to your diet if you cannot get enough fiber from foods.   °· Drink enough fluids to keep your urine clear or pale yellow.   °· Exercise regularly or as directed by your health care provider.   °· Go to the restroom when you have the urge to go. Do not hold it.   °· Only take over-the-counter or prescription medicines as directed by your health care provider. Do not take other medicines for constipation without talking to your health care provider first.   °SEEK IMMEDIATE MEDICAL CARE IF:  °· You have bright red blood in your stool.   °· Your constipation lasts for more than 4 days or gets worse.   °· You have abdominal or rectal pain.   °· You have thin, pencil-like stools.   °· You have unexplained weight loss. °MAKE SURE YOU:  °· Understand these instructions. °· Will watch your  condition. °· Will get help right away if you are not doing well or get worse. °Document Released: 06/14/2004 Document Revised: 09/21/2013 Document Reviewed: 06/28/2013 °ExitCare® Patient Information ©2015 [employer]. This information is not intended to replace advice given to you by your health care provider. Make sure you discuss any questions you have with your health care provider. ° °

## 2014-08-29 NOTE — ED Provider Notes (Signed)
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 She states the pain was intermittent and was sharp and stabbing. However since last night the pain is constant. She has nausea without vomiting, she states urinating makes the pain worse, walking or movement or bending over makes the pain worse. She was seen in the ED 2 days ago and had normal labs and a normal CT scan (normal appendix, but lots of stool in the colon). Patient reports she had 2 bowel movements after her ED visit.  On physical exam she is in no distress. When she was laid flat she did not appear to be uncomfortable. Her abdomen was soft with well localized tenderness in her right lower abdomen. She does not have Rovsing sign. She has no pain on knee tapping. She has minimal discomfort to psoas sign.  Patient just had CT scan done 2 days ago that was normal except for stool in the colon. I have discussed with patient the risk of having a second CT scan so close to the first but she is insistent that she needs to find out what is wrong because something is wrong. Patient refuses to take any pain medicine in the ED or at home.  Medical screening examination/treatment/procedure(s) were conducted as a shared visit with non-physician practitioner(s) and myself.  I personally evaluated the patient during the encounter.   EKG Interpretation None       Devoria AlbeIva Almina Schul, MD, Armando GangFACEP      Ward GivensIva L Elston Aldape, MD 08/29/14 [phone]

## 2014-08-29 NOTE — ED Notes (Signed)
REPAGED GENERAL SURGERY (DR.WYATT) FOR [facility] TO [phone]

## 2014-08-29 NOTE — ED Notes (Signed)
Patient transported to CT 

## 2014-08-29 NOTE — ED Provider Notes (Addendum)
CSN: [id number]     Arrival date & time 08/29/14  [id number] History   First MD Initiated Contact with Patient 08/29/14 [phone]     Chief Complaint  Patient presents with  . 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  Pt seen 2 days ago, 08/27/14 for same with negative workup.  Pt states pain is sharp, stabbing, throbbing, 8/10 at worst. Nothing makes pain better, walking and any "jaring" movements worsen the pain.  Denies taking any medication for pain as she does not want to "mask" the pain.  Denies fever, chills, vomiting or diarrhea. Pt reports normal BMs and urine output. No dysuria or hematuria.  LMP: 08/06/14. No hx of abdominal surgeries.   Past Medical History  Diagnosis Date  . Polycystic disease, ovaries    History reviewed. No pertinent past surgical history. History reviewed. No pertinent family history. History  Substance Use Topics  . Smoking status: Never Smoker   . Smokeless tobacco: Not on file  . Alcohol Use: Not on file   OB History    No data available     Review of Systems  Constitutional: Negative for fever, chills, diaphoresis, appetite change and fatigue.  Respiratory: Negative for cough and shortness of breath.   Cardiovascular: Negative for chest pain and palpitations.  Gastrointestinal: Positive for nausea and abdominal pain. Negative for vomiting, diarrhea and constipation.  Genitourinary: Positive for pelvic pain. Negative for dysuria, urgency, frequency, hematuria, flank pain, decreased urine volume, vaginal bleeding, vaginal discharge, difficulty urinating and vaginal pain.  Musculoskeletal: Negative for myalgias and back pain.  All other systems reviewed  and are negative.     Allergies  Articaine  Home Medications   Prior to Admission medications   Medication Sig Start Date End Date Taking? Authorizing Provider  Multiple Vitamins-Minerals (MULTIVITAMIN WITH MINERALS) tablet Take 1 tablet by mouth daily.   Yes Historical Provider, MD  vitamin C (ASCORBIC ACID) 500 MG tablet Take 500 mg by mouth daily.   Yes Historical Provider, MD  polyethylene glycol (MIRALAX / GLYCOLAX) packet Take 17 g by mouth daily. 08/27/14   Marissa Sciacca, PA-C   BP 113/62 mmHg  Pulse 79  Temp(Src) 98.1 F (36.7 C) (Oral)  Resp 15  SpO2 100%  LMP 08/06/2014 (Approximate) Physical Exam  Constitutional: She appears well-developed and well-nourished. No distress.  Pt lying comfortably in exam bed, NAD.   HENT:  Head: Normocephalic and atraumatic.  Eyes: Conjunctivae are normal. No scleral icterus.  Neck: Normal range of motion.  Cardiovascular: Normal rate, regular rhythm and normal heart sounds.   Pulmonary/Chest: Effort normal and breath sounds normal. No respiratory distress. She has no wheezes. She has no rales. She exhibits no tenderness.  Abdominal: Soft. Bowel sounds are normal. She exhibits no distension and no mass. There is tenderness. There is no rebound and no guarding.  Soft, non-distended. Tenderness without rebound in RLQ. No masses palpated. No guarding. No CVAT  Musculoskeletal: Normal range of motion.  Neurological: She is alert.  Skin: Skin is warm and dry. She is not diaphoretic.  Nursing note and vitals reviewed.   ED Course  Procedures (including critical care time) Labs Review Labs Reviewed  URINALYSIS, ROUTINE W REFLEX MICROSCOPIC - Abnormal; Notable for the following:  APPearance HAZY (*)    All other components within normal limits  CBC WITH DIFFERENTIAL  COMPREHENSIVE METABOLIC PANEL  LIPASE, BLOOD  POC URINE PREG, ED    Imaging Review US Transvaginal Non-ob  08/27/2014   CLINICAL DATA:  Right lower quadrant  pain.  EXAM: TRANSABDOMINAL AND TRANSVAGINAL ULTRASOUND OF PELVIS  DOPPLER ULTRASOUND OF OVARIES  TECHNIQUE: Both transabdominal and transvaginal ultrasound examinations of the pelvis were performed. Transabdominal technique was performed for global imaging of the pelvis including uterus, ovaries, adnexal regions, and pelvic cul-de-sac.  It was necessary to proceed with endovaginal exam following the transabdominal exam to visualize the ovaries and adnexa. Color and duplex Doppler ultrasound was utilized to evaluate blood flow to the ovaries.  COMPARISON:  CT scan dated 08/27/2014  FINDINGS: Uterus  Measurements: 6.0 x 3.4 x 4.7 cm. No fibroids or other mass visualized.  Endometrium  Thickness: 10.8 mm.  No focal abnormality visualized.  Right ovary  Measurements: 3.8 x 2.7 x 2.3 cm. Normal appearance/no adnexal mass. Normal follicles.  Left ovary  Measurements: 2.5 x 2.1 x 1.8 cm. Normal appearance/no adnexal mass. Normal follicles.  Pulsed Doppler evaluation of both ovaries demonstrates normal low-resistance arterial and venous waveforms.  Other findings  Trace free fluid in the pelvis.  IMPRESSION: Normal exam.  Normal appearing ovaries.   Electronically Signed   By: Geanie Cooley M.D.   On: 08/27/2014 17:10   US Pelvis Complete  08/27/2014   CLINICAL DATA:  Right lower quadrant pain.  EXAM: TRANSABDOMINAL AND TRANSVAGINAL ULTRASOUND OF PELVIS  DOPPLER ULTRASOUND OF OVARIES  TECHNIQUE: Both transabdominal and transvaginal ultrasound examinations of the pelvis were performed. Transabdominal technique was performed for global imaging of the pelvis including uterus, ovaries, adnexal regions, and pelvic cul-de-sac.  It was necessary to proceed with endovaginal exam following the transabdominal exam to visualize the ovaries and adnexa. Color and duplex Doppler ultrasound was utilized to evaluate blood flow to the ovaries.  COMPARISON:  CT scan dated 08/27/2014  FINDINGS: Uterus  Measurements: 6.0 x 3.4 x 4.7 cm. No  fibroids or other mass visualized.  Endometrium  Thickness: 10.8 mm.  No focal abnormality visualized.  Right ovary  Measurements: 3.8 x 2.7 x 2.3 cm. Normal appearance/no adnexal mass. Normal follicles.  Left ovary  Measurements: 2.5 x 2.1 x 1.8 cm. Normal appearance/no adnexal mass. Normal follicles.  Pulsed Doppler evaluation of both ovaries demonstrates normal low-resistance arterial and venous waveforms.  Other findings  Trace free fluid in the pelvis.  IMPRESSION: Normal exam.  Normal appearing ovaries.   Electronically Signed   By: Geanie Cooley M.D.   On: 08/27/2014 17:10   Ct Abdomen Pelvis W Contrast  08/27/2014   CLINICAL DATA:  Abdominal pain, right lower quadrant pain since Thursday  EXAM: CT ABDOMEN AND PELVIS WITH CONTRAST  TECHNIQUE: Multidetector CT imaging of the abdomen and pelvis was performed using the standard protocol following bolus administration of intravenous contrast.  CONTRAST:  OMNIPAQUE IOHEXOL 300 MG/ML  SOLN  COMPARISON:  None.  FINDINGS: Lung bases are unremarkable. Sagittal images of the spine are unremarkable.  Enhanced liver is unremarkable. The pancreas, spleen and adrenal glands are unremarkable. Abdominal aorta is unremarkable.  No small bowel obstruction. Abundant stool noted in right colon and transverse colon. There is no pericecal inflammation. There is a low lying cecum. The tip of the cecum is in right lower anterior pelvis just lateral to urinary bladder. Normal appendix retrocecal position clearly visualized axial image 71. Small  amount of nonspecific fluid is noted within uterus. No adnexal mass is noted. Significant stool noted in sigmoid colon and rectum. The rectum is distended with stool up to 6.5 cm in diameter suspicious for mild fecal impaction.  Kidneys are symmetrical in size and enhancement. No hydronephrosis or hydroureter. No destructive bony lesions are noted within pelvis. No inguinal adenopathy.  IMPRESSION: 1. No pericecal inflammation.  Abundant stool noted in right colon and transverse colon. There is a low lying cecum. Normal appendix clearly visualized. 2. No hydronephrosis or hydroureter. 3. Significant stool noted within rectum measures 6.5 cm in diameter suspicious for mild fecal impaction. 4. No adnexal mass. 5. No small bowel obstruction.   Electronically Signed   By: Natasha Mead M.D.   On: 08/27/2014 15:34   [place] Art/ven Flow Abd Pelv Doppler  08/27/2014   CLINICAL DATA:  Right lower quadrant pain.  EXAM: TRANSABDOMINAL AND TRANSVAGINAL ULTRASOUND OF PELVIS  DOPPLER ULTRASOUND OF OVARIES  TECHNIQUE: Both transabdominal and transvaginal ultrasound examinations of the pelvis were performed. Transabdominal technique was performed for global imaging of the pelvis including uterus, ovaries, adnexal regions, and pelvic cul-de-sac.  It was necessary to proceed with endovaginal exam following the transabdominal exam to visualize the ovaries and adnexa. Color and duplex Doppler ultrasound was utilized to evaluate blood flow to the ovaries.  COMPARISON:  CT scan dated 08/27/2014  FINDINGS: Uterus  Measurements: 6.0 x 3.4 x 4.7 cm. No fibroids or other mass visualized.  Endometrium  Thickness: 10.8 mm.  No focal abnormality visualized.  Right ovary  Measurements: 3.8 x 2.7 x 2.3 cm. Normal appearance/no adnexal mass. Normal follicles.  Left ovary  Measurements: 2.5 x 2.1 x 1.8 cm. Normal appearance/no adnexal mass. Normal follicles.  Pulsed Doppler evaluation of both ovaries demonstrates normal low-resistance arterial and venous waveforms.  Other findings  Trace free fluid in the pelvis.  IMPRESSION: Normal exam.  Normal appearing ovaries.   Electronically Signed   By: Geanie Cooley M.D.   On: 08/27/2014 17:10     EKG Interpretation None      MDM   Final diagnoses:  RLQ abdominal pain    Pt is a 23yo female presenting to ED with c/o worsening, persistent, RLQ pain that started 5 days ago, seen 2 days ago for same. No hx of abdominal  surgeries. Pain is worse with palpation and ambulation. She has not taken anything for pain as she does not want to "mask" her symptoms.   On exam, abdomen is soft, non-distended. Tenderness in RLQ but no rebound, masses or guarding. No CVAT.  Discussed pt with Dr. Devoria Albe, previous medical records reviewed including normal labs, Abd CT-significant for stool burden, otherwise, unremarkable.  U/S pelvis: normal at that time. Today, pt remains afebrile. Labs: WNL.  Pt is adament the pain is not pelvic/ovarian or uterine in nature as she has had multiple cysts in the past.  U/S pelvis performed to r/o ovarian torsion.  Dr. Lynelle Doctor hesitant on repeating CT scan due to radiation concerns as pt is afebrile and still has a normal WBC.  Repeat Pelvic U/S with art/ven flo also performed: normal pelvic ultrasound.   1:01 PM Pt declined pelvic exam as she is insistent it is not due to GU causes.  Dr. Lynelle Doctor examined and spoke with pt, discussed risk of radiation due to additional CT scan, insists on getting repeat CT abdominal exam to r/o appendicitis.    CT abdominal exam pending. Pt continues to decline pain medication.  CT abdomen: negative for appendicitis. Negative CT abdomen and pelvis.   Imaging reviewed by this PA as well as Dr. Devoria AlbeIva Knapp, imaging appears consistent with moderate stool burden as seen on 08/27/14 in right colon and transverse colon. Discussed imaging with pt, even showed pt imaging results. Pt is insists she has normal bowel movements and had 2 BMs after CT two days ago. Pt states she has not tried miralax prescribed to her at that time as she disagrees she is constipated.  Advised pt she is welcome to get additional opinion from medical providers.   Will refer to PCP and GI, however, at this time, no indication of emergent process taking place at this time. Due to pt's insistence, will consult with general surgery.     5:00 PM Pt [facility] at shift change.  General surgery consult for additional guidance and opinion pending.     Junius FinnerErin O'Malley, PA-C 08/30/14 [id number]  Ward GivensIva L Knapp, MD 08/30/14 [phone]

## 2014-08-29 NOTE — ED Provider Notes (Signed)
4:58 PM Patient signed out to me at change of shift by Ann FinnerErin Howard.  Pt with RLQ abdominal pain x 5 days, has been to the ED twice in this time with two negative pelvic US and two negative CT abd/pelvis.  Labs unremarkable.  Pelvic two days unremarkable.  UA negative.  Pt has remained afebrile.  No associated symptoms.  She declines pain medication and is not taking medication at home.  CTs have shown very large stool burden.  D/C with miralax at last visit, she has not used this.  Signed out to me pending general surgery consult for second opinion.    5:56 PM Discussed patient's symptoms and results with pt.  I suspect outpatient GI follow up may be helpful for this patient.  However, pt is very concerned that she has an atypical presentation of appendicitis.  She does have large amount of stool on CT but she denies that this is the cause, she thinks this may be the result of being in so much pain. Pt states she would appreciate discussion with general surgery.    6:25 PM Discussed pt with Ann Howard.  He will see the patient in consultation.    7:25 PM Per Ann Howard, Ann Howard has recommended 5 day course of Augmentin and follow up with him.  Will d/c as recommended.    Results for orders placed or performed during the hospital encounter of 08/29/14  CBC with Differential  Result Value Ref Range   WBC 7.1 4.0 - 10.5 K/uL   RBC 4.27 3.87 - 5.11 MIL/uL   Hemoglobin 13.1 12.0 - 15.0 g/dL   HCT 78.239.5 95.636.0 - 21.346.0 %   MCV 92.5 78.0 - 100.0 fL   MCH 30.7 26.0 - 34.0 pg   MCHC 33.2 30.0 - 36.0 g/dL   RDW 08.612.8 57.811.5 - 46.915.5 %   Platelets 182 150 - 400 K/uL   Neutrophils Relative % 75 43 - 77 %   Neutro Abs 5.2 1.7 - 7.7 K/uL   Lymphocytes Relative 13 12 - 46 %   Lymphs Abs 0.9 0.7 - 4.0 K/uL   Monocytes Relative 12 3 - 12 %   Monocytes Absolute 0.9 0.1 - 1.0 K/uL   Eosinophils Relative 0 0 - 5 %   Eosinophils Absolute 0.0 0.0 - 0.7 K/uL   Basophils Relative 0 0 - 1 %   Basophils Absolute 0.0 0.0 -  0.1 K/uL  Comprehensive metabolic panel  Result Value Ref Range   Sodium 140 137 - 147 mEq/L   Potassium 3.7 3.7 - 5.3 mEq/L   Chloride 102 96 - 112 mEq/L   CO2 25 19 - 32 mEq/L   Glucose, Bld 86 70 - 99 mg/dL   BUN 11 6 - 23 mg/dL   Creatinine, Ser 6.290.76 0.50 - 1.10 mg/dL   Calcium 9.0 8.4 - 52.810.5 mg/dL   Total Protein 7.2 6.0 - 8.3 g/dL   Albumin 3.7 3.5 - 5.2 g/dL   AST 19 0 - 37 U/L   ALT 14 0 - 35 U/L   Alkaline Phosphatase 49 39 - 117 U/L   Total Bilirubin 0.5 0.3 - 1.2 mg/dL   GFR calc non Af Amer >90 >90 mL/min   GFR calc Af Amer >90 >90 mL/min   Anion gap 13 5 - 15  Lipase, blood  Result Value Ref Range   Lipase 24 11 - 59 U/L  Urinalysis, Routine w reflex microscopic  Result Value Ref Range  Color, Urine YELLOW YELLOW   APPearance HAZY (A) CLEAR   Specific Gravity, Urine 1.007 1.005 - 1.030   pH 7.5 5.0 - 8.0   Glucose, UA NEGATIVE NEGATIVE mg/dL   Hgb urine dipstick NEGATIVE NEGATIVE   Bilirubin Urine NEGATIVE NEGATIVE   Ketones, ur NEGATIVE NEGATIVE mg/dL   Protein, ur NEGATIVE NEGATIVE mg/dL   Urobilinogen, UA 0.2 0.0 - 1.0 mg/dL   Nitrite NEGATIVE NEGATIVE   Leukocytes, UA NEGATIVE NEGATIVE  POC urine preg, ED (not [facility])  Result Value Ref Range   Preg Test, Ur NEGATIVE NEGATIVE   [place] Transvaginal Non-ob  08/29/2014   CLINICAL DATA:  Right lower quadrant pain  EXAM: TRANSABDOMINAL AND TRANSVAGINAL ULTRASOUND OF PELVIS  DOPPLER ULTRASOUND OF OVARIES  TECHNIQUE: Both transabdominal and transvaginal ultrasound examinations of the pelvis were performed. Transabdominal technique was performed for global imaging of the pelvis including uterus, ovaries, adnexal regions, and pelvic cul-de-sac.  It was necessary to proceed with endovaginal exam following the transabdominal exam to visualize the endometrium and ovaries. Color and duplex Doppler ultrasound was utilized to evaluate blood flow to the ovaries.  COMPARISON:  None.  FINDINGS: Uterus  Measurements: 7.8 x 3 x  4.8 cm. No fibroids or other mass visualized.  Endometrium  Thickness: 9.2 mm.  No focal abnormality visualized.  Right ovary  Measurements: 3 x 2.3 x 2.4 cm. Multiple ovarian follicles with the dominant follicle measuring 2 x 1.7 x 1.8 cm. Normal appearance/no adnexal mass.  Left ovary  Measurements: 2.8 x 1.5 x 1.2 cm. Multiple ovarian follicles with the dominant follicle measuring 1.1 x 0.7 x 0.8 cm. Normal appearance/no adnexal mass.  Pulsed Doppler evaluation of both ovaries demonstrates normal low-resistance arterial and venous waveforms.  Other findings  No free fluid.  IMPRESSION: 1. Normal pelvic ultrasound.   Electronically Signed   By: Ann KoHetal  Patel   On: 08/29/2014 11:22   [place] Transvaginal Non-ob  08/27/2014   CLINICAL DATA:  Right lower quadrant pain.  EXAM: TRANSABDOMINAL AND TRANSVAGINAL ULTRASOUND OF PELVIS  DOPPLER ULTRASOUND OF OVARIES  TECHNIQUE: Both transabdominal and transvaginal ultrasound examinations of the pelvis were performed. Transabdominal technique was performed for global imaging of the pelvis including uterus, ovaries, adnexal regions, and pelvic cul-de-sac.  It was necessary to proceed with endovaginal exam following the transabdominal exam to visualize the ovaries and adnexa. Color and duplex Doppler ultrasound was utilized to evaluate blood flow to the ovaries.  COMPARISON:  CT scan dated 08/27/2014  FINDINGS: Uterus  Measurements: 6.0 x 3.4 x 4.7 cm. No fibroids or other mass visualized.  Endometrium  Thickness: 10.8 mm.  No focal abnormality visualized.  Right ovary  Measurements: 3.8 x 2.7 x 2.3 cm. Normal appearance/no adnexal mass. Normal follicles.  Left ovary  Measurements: 2.5 x 2.1 x 1.8 cm. Normal appearance/no adnexal mass. Normal follicles.  Pulsed Doppler evaluation of both ovaries demonstrates normal low-resistance arterial and venous waveforms.  Other findings  Trace free fluid in the pelvis.  IMPRESSION: Normal exam.  Normal appearing ovaries.   Electronically  Signed   By: Ann CooleyJim  Howard M.D.   On: 08/27/2014 17:10   [place] Pelvis Complete  08/29/2014   CLINICAL DATA:  Right lower quadrant pain  EXAM: TRANSABDOMINAL AND TRANSVAGINAL ULTRASOUND OF PELVIS  DOPPLER ULTRASOUND OF OVARIES  TECHNIQUE: Both transabdominal and transvaginal ultrasound examinations of the pelvis were performed. Transabdominal technique was performed for global imaging of the pelvis including uterus, ovaries, adnexal regions, and pelvic cul-de-sac.  It was  necessary to proceed with endovaginal exam following the transabdominal exam to visualize the endometrium and ovaries. Color and duplex Doppler ultrasound was utilized to evaluate blood flow to the ovaries.  COMPARISON:  None.  FINDINGS: Uterus  Measurements: 7.8 x 3 x 4.8 cm. No fibroids or other mass visualized.  Endometrium  Thickness: 9.2 mm.  No focal abnormality visualized.  Right ovary  Measurements: 3 x 2.3 x 2.4 cm. Multiple ovarian follicles with the dominant follicle measuring 2 x 1.7 x 1.8 cm. Normal appearance/no adnexal mass.  Left ovary  Measurements: 2.8 x 1.5 x 1.2 cm. Multiple ovarian follicles with the dominant follicle measuring 1.1 x 0.7 x 0.8 cm. Normal appearance/no adnexal mass.  Pulsed Doppler evaluation of both ovaries demonstrates normal low-resistance arterial and venous waveforms.  Other findings  No free fluid.  IMPRESSION: 1. Normal pelvic ultrasound.   Electronically Signed   By: Ann Howard   On: 08/29/2014 11:22   US Pelvis Complete  08/27/2014   CLINICAL DATA:  Right lower quadrant pain.  EXAM: TRANSABDOMINAL AND TRANSVAGINAL ULTRASOUND OF PELVIS  DOPPLER ULTRASOUND OF OVARIES  TECHNIQUE: Both transabdominal and transvaginal ultrasound examinations of the pelvis were performed. Transabdominal technique was performed for global imaging of the pelvis including uterus, ovaries, adnexal regions, and pelvic cul-de-sac.  It was necessary to proceed with endovaginal exam following the transabdominal exam to visualize  the ovaries and adnexa. Color and duplex Doppler ultrasound was utilized to evaluate blood flow to the ovaries.  COMPARISON:  CT scan dated 08/27/2014  FINDINGS: Uterus  Measurements: 6.0 x 3.4 x 4.7 cm. No fibroids or other mass visualized.  Endometrium  Thickness: 10.8 mm.  No focal abnormality visualized.  Right ovary  Measurements: 3.8 x 2.7 x 2.3 cm. Normal appearance/no adnexal mass. Normal follicles.  Left ovary  Measurements: 2.5 x 2.1 x 1.8 cm. Normal appearance/no adnexal mass. Normal follicles.  Pulsed Doppler evaluation of both ovaries demonstrates normal low-resistance arterial and venous waveforms.  Other findings  Trace free fluid in the pelvis.  IMPRESSION: Normal exam.  Normal appearing ovaries.   Electronically Signed   By: Ann Howard M.D.   On: 08/27/2014 17:10   Ct Abdomen Pelvis W Contrast  08/29/2014   CLINICAL DATA:  Right lower quadrant pain with guarding and nausea.  EXAM: CT ABDOMEN AND PELVIS WITH CONTRAST  TECHNIQUE: Multidetector CT imaging of the abdomen and pelvis was performed using the standard protocol following bolus administration of intravenous contrast.  CONTRAST:  100 mL OMNIPAQUE IOHEXOL 300 MG/ML  SOLN  COMPARISON:  CT abdomen and pelvis 08/27/2014.  FINDINGS: The lung bases are clear.  No pleural or pericardial effusion.  The appendix is well seen and appears normal. The stomach and small and large bowel appear normal. Uterus, adnexa and urinary bladder appear normal.  The liver, gallbladder, adrenal glands, spleen, pancreas, kidneys and biliary tree each appear normal. There is no lymphadenopathy or fluid. No bony abnormality is identified.  IMPRESSION: Negative for appendicitis.  Negative CT abdomen and pelvis.   Electronically Signed   By: Ann Howard M.D.   On: 08/29/2014 16:08   Ct Abdomen Pelvis W Contrast  08/27/2014   CLINICAL DATA:  Abdominal pain, right lower quadrant pain since Thursday  EXAM: CT ABDOMEN AND PELVIS WITH CONTRAST  TECHNIQUE:  Multidetector CT imaging of the abdomen and pelvis was performed using the standard protocol following bolus administration of intravenous contrast.  CONTRAST:  OMNIPAQUE IOHEXOL 300 MG/ML  SOLN  COMPARISON:  None.  FINDINGS: Lung bases are unremarkable. Sagittal images of the spine are unremarkable.  Enhanced liver is unremarkable. The pancreas, spleen and adrenal glands are unremarkable. Abdominal aorta is unremarkable.  No small bowel obstruction. Abundant stool noted in right colon and transverse colon. There is no pericecal inflammation. There is a low lying cecum. The tip of the cecum is in right lower anterior pelvis just lateral to urinary bladder. Normal appendix retrocecal position clearly visualized axial image 71. Small amount of nonspecific fluid is noted within uterus. No adnexal mass is noted. Significant stool noted in sigmoid colon and rectum. The rectum is distended with stool up to 6.5 cm in diameter suspicious for mild fecal impaction.  Kidneys are symmetrical in size and enhancement. No hydronephrosis or hydroureter. No destructive bony lesions are noted within pelvis. No inguinal adenopathy.  IMPRESSION: 1. No pericecal inflammation. Abundant stool noted in right colon and transverse colon. There is a low lying cecum. Normal appendix clearly visualized. 2. No hydronephrosis or hydroureter. 3. Significant stool noted within rectum measures 6.5 cm in diameter suspicious for mild fecal impaction. 4. No adnexal mass. 5. No small bowel obstruction.   Electronically Signed   By: Ann Howard M.D.   On: 08/27/2014 15:34   [place] Art/ven Flow Abd Pelv Doppler  08/29/2014   CLINICAL DATA:  Right lower quadrant pain  EXAM: TRANSABDOMINAL AND TRANSVAGINAL ULTRASOUND OF PELVIS  DOPPLER ULTRASOUND OF OVARIES  TECHNIQUE: Both transabdominal and transvaginal ultrasound examinations of the pelvis were performed. Transabdominal technique was performed for global imaging of the pelvis including uterus,  ovaries, adnexal regions, and pelvic cul-de-sac.  It was necessary to proceed with endovaginal exam following the transabdominal exam to visualize the endometrium and ovaries. Color and duplex Doppler ultrasound was utilized to evaluate blood flow to the ovaries.  COMPARISON:  None.  FINDINGS: Uterus  Measurements: 7.8 x 3 x 4.8 cm. No fibroids or other mass visualized.  Endometrium  Thickness: 9.2 mm.  No focal abnormality visualized.  Right ovary  Measurements: 3 x 2.3 x 2.4 cm. Multiple ovarian follicles with the dominant follicle measuring 2 x 1.7 x 1.8 cm. Normal appearance/no adnexal mass.  Left ovary  Measurements: 2.8 x 1.5 x 1.2 cm. Multiple ovarian follicles with the dominant follicle measuring 1.1 x 0.7 x 0.8 cm. Normal appearance/no adnexal mass.  Pulsed Doppler evaluation of both ovaries demonstrates normal low-resistance arterial and venous waveforms.  Other findings  No free fluid.  IMPRESSION: 1. Normal pelvic ultrasound.   Electronically Signed   By: Ann Howard   On: 08/29/2014 11:22   [place] Art/ven Flow Abd Pelv Doppler  08/27/2014   CLINICAL DATA:  Right lower quadrant pain.  EXAM: TRANSABDOMINAL AND TRANSVAGINAL ULTRASOUND OF PELVIS  DOPPLER ULTRASOUND OF OVARIES  TECHNIQUE: Both transabdominal and transvaginal ultrasound examinations of the pelvis were performed. Transabdominal technique was performed for global imaging of the pelvis including uterus, ovaries, adnexal regions, and pelvic cul-de-sac.  It was necessary to proceed with endovaginal exam following the transabdominal exam to visualize the ovaries and adnexa. Color and duplex Doppler ultrasound was utilized to evaluate blood flow to the ovaries.  COMPARISON:  CT scan dated 08/27/2014  FINDINGS: Uterus  Measurements: 6.0 x 3.4 x 4.7 cm. No fibroids or other mass visualized.  Endometrium  Thickness: 10.8 mm.  No focal abnormality visualized.  Right ovary  Measurements: 3.8 x 2.7 x 2.3 cm. Normal appearance/no adnexal mass. Normal  follicles.  Left ovary  Measurements: 2.5 x 2.1  x 1.8 cm. Normal appearance/no adnexal mass. Normal follicles.  Pulsed Doppler evaluation of both ovaries demonstrates normal low-resistance arterial and venous waveforms.  Other findings  Trace free fluid in the pelvis.  IMPRESSION: Normal exam.  Normal appearing ovaries.   Electronically Signed   By: Ann Howard M.D.   On: 08/27/2014 17:10      Trixie Dredge, PA-C 08/29/14 1930  Ward Givens, MD 08/30/14 [phone]

## 2014-08-30 LAB — GC/CHLAMYDIA PROBE AMP
CT PROBE, AMP APTIMA: NEGATIVE
GC PROBE AMP APTIMA: NEGATIVE

## 2014-08-31 ENCOUNTER — Ambulatory Visit: Payer: Self-pay | Admitting: General Surgery

## 2014-08-31 ENCOUNTER — Encounter (HOSPITAL_COMMUNITY)
Admission: AD | Disposition: A | Discharge status: Home / Self Care | Payer: Self-pay | Source: Ambulatory Visit | Attending: General Surgery

## 2014-08-31 ENCOUNTER — Observation Stay (HOSPITAL_COMMUNITY): Payer: 59 | Admitting: Anesthesiology

## 2014-08-31 ENCOUNTER — Observation Stay (HOSPITAL_COMMUNITY)
Admission: AD | Admit: 2014-08-31 | Discharge: 2014-09-01 | Disposition: A | Discharge status: Home / Self Care | Payer: 59 | Source: Ambulatory Visit | Attending: General Surgery | Admitting: General Surgery

## 2014-08-31 ENCOUNTER — Encounter (HOSPITAL_COMMUNITY): Payer: Self-pay | Admitting: General Practice

## 2014-08-31 DIAGNOSIS — R1031 Right lower quadrant pain: Secondary | ICD-10-CM | POA: Diagnosis present

## 2014-08-31 DIAGNOSIS — K660 Peritoneal adhesions (postprocedural) (postinfection): Secondary | ICD-10-CM | POA: Diagnosis not present

## 2014-08-31 DIAGNOSIS — K3580 Unspecified acute appendicitis: Principal | ICD-10-CM

## 2014-08-31 DIAGNOSIS — E282 Polycystic ovarian syndrome: Secondary | ICD-10-CM | POA: Insufficient documentation

## 2014-08-31 DIAGNOSIS — Z888 Allergy status to other drugs, medicaments and biological substances status: Secondary | ICD-10-CM | POA: Insufficient documentation

## 2014-08-31 HISTORY — PX: LAPAROSCOPY: SHX197

## 2014-08-31 HISTORY — PX: LAPAROSCOPIC APPENDECTOMY: SHX408

## 2014-08-31 LAB — CBC WITH DIFFERENTIAL/PLATELET
BASOS ABS: 0 10*3/uL (ref 0.0–0.1)
Basophils Relative: 0 % (ref 0–1)
EOS PCT: 0 % (ref 0–5)
Eosinophils Absolute: 0 10*3/uL (ref 0.0–0.7)
HCT: 42.8 % (ref 36.0–46.0)
Hemoglobin: 14.3 g/dL (ref 12.0–15.0)
LYMPHS PCT: 22 % (ref 12–46)
Lymphs Abs: 1.1 10*3/uL (ref 0.7–4.0)
MCH: 31.4 pg (ref 26.0–34.0)
MCHC: 33.4 g/dL (ref 30.0–36.0)
MCV: 94.1 fL (ref 78.0–100.0)
Monocytes Absolute: 1.1 10*3/uL — ABNORMAL HIGH (ref 0.1–1.0)
Monocytes Relative: 22 % — ABNORMAL HIGH (ref 3–12)
Neutro Abs: 2.7 10*3/uL (ref 1.7–7.7)
Neutrophils Relative %: 56 % (ref 43–77)
PLATELETS: 192 10*3/uL (ref 150–400)
RBC: 4.55 MIL/uL (ref 3.87–5.11)
RDW: 12.7 % (ref 11.5–15.5)
WBC: 4.9 10*3/uL (ref 4.0–10.5)

## 2014-08-31 LAB — SURGICAL PCR SCREEN
MRSA, PCR: NEGATIVE
Staphylococcus aureus: NEGATIVE

## 2014-08-31 SURGERY — LAPAROSCOPY, DIAGNOSTIC
Anesthesia: General | Site: Abdomen

## 2014-08-31 MED ORDER — NEOSTIGMINE METHYLSULFATE 10 MG/10ML IV SOLN
INTRAVENOUS | Status: DC | PRN
  Administered 2014-08-31: 2 mg via INTRAVENOUS

## 2014-08-31 MED ORDER — ONDANSETRON HCL 4 MG PO TABS: 4.0000 mg | ORAL_TABLET | Freq: Four times a day (QID) | ORAL | Status: DC | PRN

## 2014-08-31 MED ORDER — DEXTROSE 5 % IV SOLN
2.0000 g | INTRAVENOUS | Status: AC
  Administered 2014-08-31: 2 g via INTRAVENOUS
  Filled 2014-08-31 (×2): qty 2

## 2014-08-31 MED ORDER — ROCURONIUM BROMIDE 50 MG/5ML IV SOLN
INTRAVENOUS | Status: AC
  Filled 2014-08-31: qty 1

## 2014-08-31 MED ORDER — DEXAMETHASONE SODIUM PHOSPHATE 10 MG/ML IJ SOLN
INTRAMUSCULAR | Status: DC | PRN
  Administered 2014-08-31: 10 mg via INTRAVENOUS

## 2014-08-31 MED ORDER — LIDOCAINE HCL (CARDIAC) 20 MG/ML IV SOLN
INTRAVENOUS | Status: AC
  Filled 2014-08-31: qty 5

## 2014-08-31 MED ORDER — FLUCONAZOLE 150 MG PO TABS
150.0000 mg | ORAL_TABLET | Freq: Every day | ORAL | Status: DC
  Administered 2014-08-31: 150 mg via ORAL
  Filled 2014-08-31 (×2): qty 1

## 2014-08-31 MED ORDER — PROMETHAZINE HCL 25 MG/ML IJ SOLN: 6.2500 mg | INTRAMUSCULAR | Status: DC | PRN

## 2014-08-31 MED ORDER — LACTATED RINGERS IV SOLN
INTRAVENOUS | Status: DC | PRN
  Administered 2014-08-31 (×2): via INTRAVENOUS

## 2014-08-31 MED ORDER — FENTANYL CITRATE 0.05 MG/ML IJ SOLN
INTRAMUSCULAR | Status: AC
  Filled 2014-08-31: qty 5

## 2014-08-31 MED ORDER — ONDANSETRON HCL 4 MG/2ML IJ SOLN
INTRAMUSCULAR | Status: DC | PRN
  Administered 2014-08-31 (×2): 4 mg via INTRAVENOUS

## 2014-08-31 MED ORDER — HYDROMORPHONE HCL 1 MG/ML IJ SOLN
INTRAMUSCULAR | Status: AC
  Filled 2014-08-31: qty 1

## 2014-08-31 MED ORDER — ONDANSETRON HCL 4 MG/2ML IJ SOLN: 4.0000 mg | Freq: Four times a day (QID) | INTRAMUSCULAR | Status: DC | PRN

## 2014-08-31 MED ORDER — MIDAZOLAM HCL 5 MG/5ML IJ SOLN
INTRAMUSCULAR | Status: DC | PRN
  Administered 2014-08-31: 2 mg via INTRAVENOUS

## 2014-08-31 MED ORDER — KCL IN DEXTROSE-NACL 20-5-0.45 MEQ/L-%-% IV SOLN
INTRAVENOUS | Status: DC
  Administered 2014-08-31: 20:00:00 via INTRAVENOUS
  Filled 2014-08-31 (×3): qty 1000

## 2014-08-31 MED ORDER — OXYCODONE-ACETAMINOPHEN 5-325 MG PO TABS
1.0000 | ORAL_TABLET | ORAL | Status: DC | PRN
  Administered 2014-08-31 – 2014-09-01 (×3): 1 via ORAL
  Administered 2014-09-01: 2 via ORAL
  Filled 2014-08-31 (×2): qty 1
  Filled 2014-08-31: qty 2
  Filled 2014-08-31: qty 1

## 2014-08-31 MED ORDER — PROPOFOL 10 MG/ML IV BOLUS
INTRAVENOUS | Status: AC
  Filled 2014-08-31: qty 20

## 2014-08-31 MED ORDER — BUPIVACAINE-EPINEPHRINE (PF) 0.25% -1:200000 IJ SOLN
INTRAMUSCULAR | Status: AC
  Filled 2014-08-31: qty 30

## 2014-08-31 MED ORDER — GLYCOPYRROLATE 0.2 MG/ML IJ SOLN
INTRAMUSCULAR | Status: DC | PRN
  Administered 2014-08-31: 0.2 mg via INTRAVENOUS

## 2014-08-31 MED ORDER — MIDAZOLAM HCL 2 MG/2ML IJ SOLN
INTRAMUSCULAR | Status: AC
  Filled 2014-08-31: qty 2

## 2014-08-31 MED ORDER — OXYCODONE HCL 5 MG/5ML PO SOLN: 5.0000 mg | Freq: Once | ORAL | Status: DC | PRN

## 2014-08-31 MED ORDER — BUPIVACAINE-EPINEPHRINE 0.25% -1:200000 IJ SOLN
INTRAMUSCULAR | Status: DC | PRN
  Administered 2014-08-31: 30 mL

## 2014-08-31 MED ORDER — LIDOCAINE HCL (CARDIAC) 20 MG/ML IV SOLN
INTRAVENOUS | Status: DC | PRN
  Administered 2014-08-31: 70 mg via INTRAVENOUS

## 2014-08-31 MED ORDER — SODIUM CHLORIDE 0.9 % IR SOLN
Status: DC | PRN
  Administered 2014-08-31: 1000 mL

## 2014-08-31 MED ORDER — ENOXAPARIN SODIUM 40 MG/0.4ML ~~LOC~~ SOLN
40.0000 mg | SUBCUTANEOUS | Status: DC
  Filled 2014-08-31: qty 0.4

## 2014-08-31 MED ORDER — PROPOFOL 10 MG/ML IV BOLUS
INTRAVENOUS | Status: DC | PRN
  Administered 2014-08-31: 200 mg via INTRAVENOUS

## 2014-08-31 MED ORDER — HYDROCODONE-ACETAMINOPHEN 5-325 MG PO TABS: 1.0000 | ORAL_TABLET | ORAL | Status: DC | PRN

## 2014-08-31 MED ORDER — HYDROMORPHONE HCL 1 MG/ML IJ SOLN
0.5000 mg | INTRAMUSCULAR | Status: DC | PRN
Start: 2014-08-31 — End: 2014-09-01

## 2014-08-31 MED ORDER — LACTATED RINGERS IV SOLN
INTRAVENOUS | Status: DC
  Administered 2014-08-31: 15:00:00 via INTRAVENOUS

## 2014-08-31 MED ORDER — ONDANSETRON HCL 4 MG/2ML IJ SOLN
INTRAMUSCULAR | Status: AC
  Filled 2014-08-31: qty 2

## 2014-08-31 MED ORDER — ROCURONIUM BROMIDE 100 MG/10ML IV SOLN
INTRAVENOUS | Status: DC | PRN
  Administered 2014-08-31: 50 mg via INTRAVENOUS

## 2014-08-31 MED ORDER — OXYCODONE HCL 5 MG PO TABS: 5.0000 mg | ORAL_TABLET | Freq: Once | ORAL | Status: DC | PRN

## 2014-08-31 MED ORDER — FENTANYL CITRATE 0.05 MG/ML IJ SOLN
INTRAMUSCULAR | Status: DC | PRN
  Administered 2014-08-31: 75 ug via INTRAVENOUS
  Administered 2014-08-31 (×2): 50 ug via INTRAVENOUS
  Administered 2014-08-31: 25 ug via INTRAVENOUS
  Administered 2014-08-31: 50 ug via INTRAVENOUS

## 2014-08-31 MED ORDER — GLYCOPYRROLATE 0.2 MG/ML IJ SOLN
INTRAMUSCULAR | Status: AC
  Filled 2014-08-31: qty 2

## 2014-08-31 MED ORDER — HYDROMORPHONE HCL 1 MG/ML IJ SOLN
0.2500 mg | INTRAMUSCULAR | Status: DC | PRN
  Administered 2014-08-31 (×3): 0.5 mg via INTRAVENOUS

## 2014-08-31 MED ORDER — 0.9 % SODIUM CHLORIDE (POUR BTL) OPTIME
TOPICAL | Status: DC | PRN
  Administered 2014-08-31: 1000 mL

## 2014-08-31 SURGICAL SUPPLY — 47 items
APPLIER CLIP 5 13 M/L LIGAMAX5 (MISCELLANEOUS)
APPLIER CLIP ROT 10 11.4 M/L (STAPLE)
BLADE SURG ROTATE 9660 (MISCELLANEOUS) ×2 IMPLANT
CANISTER SUCTION 2500CC (MISCELLANEOUS) ×2 IMPLANT
CHLORAPREP W/TINT 26ML (MISCELLANEOUS) ×2 IMPLANT
CLIP APPLIE 5 13 M/L LIGAMAX5 (MISCELLANEOUS) IMPLANT
CLIP APPLIE ROT 10 11.4 M/L (STAPLE) IMPLANT
COVER SURGICAL LIGHT HANDLE (MISCELLANEOUS) ×2 IMPLANT
CUTTER LINEAR ENDO 35 ETS (STAPLE) ×2 IMPLANT
CUTTER LINEAR ENDO 35 ETS TH (STAPLE) IMPLANT
DERMABOND ADVANCED (GAUZE/BANDAGES/DRESSINGS) ×1
DERMABOND ADVANCED .7 DNX12 (GAUZE/BANDAGES/DRESSINGS) ×1 IMPLANT
DRAPE LAPAROSCOPIC ABDOMINAL (DRAPES) ×2 IMPLANT
DRAPE UTILITY XL STRL (DRAPES) ×4 IMPLANT
DRSG TEGADERM 2-3/8X2-3/4 SM (GAUZE/BANDAGES/DRESSINGS) ×6 IMPLANT
ELECT REM PT RETURN 9FT ADLT (ELECTROSURGICAL) ×2
ELECTRODE REM PT RTRN 9FT ADLT (ELECTROSURGICAL) ×1 IMPLANT
ENDOLOOP SUT PDS II  0 18 (SUTURE)
ENDOLOOP SUT PDS II 0 18 (SUTURE) IMPLANT
GLOVE BIOGEL PI IND STRL 8 (GLOVE) ×1 IMPLANT
GLOVE BIOGEL PI INDICATOR 8 (GLOVE) ×1
GLOVE ECLIPSE 7.5 STRL STRAW (GLOVE) ×2 IMPLANT
GOWN STRL REUS W/ TWL LRG LVL3 (GOWN DISPOSABLE) ×2 IMPLANT
GOWN STRL REUS W/TWL LRG LVL3 (GOWN DISPOSABLE) ×2
KIT BASIN OR (CUSTOM PROCEDURE TRAY) ×2 IMPLANT
KIT ROOM TURNOVER OR (KITS) ×2 IMPLANT
NS IRRIG 1000ML POUR BTL (IV SOLUTION) ×2 IMPLANT
PAD ARMBOARD 7.5X6 YLW CONV (MISCELLANEOUS) ×4 IMPLANT
PENCIL BUTTON HOLSTER BLD 10FT (ELECTRODE) IMPLANT
POUCH SPECIMEN RETRIEVAL 10MM (ENDOMECHANICALS) ×2 IMPLANT
RELOAD /EVU35 (ENDOMECHANICALS) IMPLANT
RELOAD CUTTER ETS 35MM STAND (ENDOMECHANICALS) IMPLANT
SCALPEL HARMONIC ACE (MISCELLANEOUS) ×2 IMPLANT
SCISSORS LAP 5X35 DISP (ENDOMECHANICALS) ×2 IMPLANT
SET IRRIG TUBING LAPAROSCOPIC (IRRIGATION / IRRIGATOR) ×2 IMPLANT
SLEEVE ENDOPATH XCEL 5M (ENDOMECHANICALS) ×2 IMPLANT
SPECIMEN JAR SMALL (MISCELLANEOUS) ×2 IMPLANT
STRIP CLOSURE SKIN 1/2X4 (GAUZE/BANDAGES/DRESSINGS) ×2 IMPLANT
SUT MNCRL AB 4-0 PS2 18 (SUTURE) ×2 IMPLANT
TOWEL OR 17X24 6PK STRL BLUE (TOWEL DISPOSABLE) ×4 IMPLANT
TRAY FOLEY CATH 14FR (SET/KITS/TRAYS/PACK) ×2 IMPLANT
TRAY LAPAROSCOPIC (CUSTOM PROCEDURE TRAY) ×2 IMPLANT
TROCAR XCEL BLUNT TIP 100MML (ENDOMECHANICALS) ×2 IMPLANT
TROCAR XCEL NON-BLD 11X100MML (ENDOMECHANICALS) ×2 IMPLANT
TROCAR XCEL NON-BLD 5MMX100MML (ENDOMECHANICALS) ×2 IMPLANT
TUBING INSUFFLATION (TUBING) ×2 IMPLANT
WATER STERILE IRR 1000ML POUR (IV SOLUTION) IMPLANT

## 2014-08-31 NOTE — Anesthesia Postprocedure Evaluation (Signed)
  Anesthesia Post-op Note  Patient: Ann HawthorneAshley Howard  Procedure(s) Performed: Procedure(s): LAPAROSCOPY DIAGNOSTIC (N/A) APPENDECTOMY LAPAROSCOPIC (N/A)  Patient Location: PACU  Anesthesia Type:General  Level of Consciousness: awake, alert  and oriented  Airway and Oxygen Therapy: Patient Spontanous Breathing and Patient connected to nasal cannula oxygen  Post-op Pain: mild  Post-op Assessment: Post-op Vital signs reviewed, Patient's Cardiovascular Status Stable, Respiratory Function Stable, Patent Airway and Pain level controlled  Post-op Vital Signs: stable  Last Vitals:  Filed Vitals:   08/31/14 2141  BP: 110/57  Pulse: 80  Temp: 36.6 C  Resp: 16    Complications: No apparent anesthesia complications

## 2014-08-31 NOTE — Op Note (Signed)
OPERATIVE REPORT  DATE OF OPERATION: 08/31/2014  PATIENT:  Ann HawthorneAshley Storey  23 y.o. female  PRE-OPERATIVE DIAGNOSIS:  abdominal pain  POST-OPERATIVE DIAGNOSIS:  abdominal pain adhesion of right colon to anterior and lateral abdominal wall  PROCEDURE:  Procedure(s): LAPAROSCOPY DIAGNOSTIC APPENDECTOMY LAPAROSCOPIC LAPAROSCOPIC LYSIS OF ADHESIONS  SURGEON:  Surgeon(s): Frederik SchmidtJay Stefen Juba, MD  ASSISTANT: None  ANESTHESIA:   general  EBL: <20 ml  BLOOD ADMINISTERED: none  DRAINS: none   SPECIMEN:  Source of Specimen:  Appendix  COUNTS CORRECT:  YES  PROCEDURE DETAILS: The patient was taken to the operating room and placed on table in the supine position. After an adequate general endotracheal anesthetic was administered she was prepped and draped in usual sterile manner exposing her abdomen.  The patient was brought to the operating room because of continued right lower quadrant abdominal pain without any known source. Prior workup has been negative for acute inflammatory disease including the appendix. However, because of the continued pain a diagnostic laparoscopy was aborted with a possible appendectomy.  A proper timeout was performed identifying the patient and the procedure to be performed. An infraumbilical midline incision was made using a #15 blade and taken down to the midline fascia. Subsequently bluntly dissected down into the perineal cavity. A fascial stitch of 0 Vicryl was passed in a pursestring manner. This secured in a Hassan cannula was subsequently was used to insufflate carbon dioxide gas up to a maximal intra-abdominal pressure of 15 mmHg. A right upper quadrant 5 mm cannula and a left low quadrant 5 mm cannula pass under direct vision. The patient was placed in Trendelenburg and the left side was tilted down.  It was noted upon initial entrance into the peritoneal cavity that the patient had a significant amount of adhesions of the ascending colon and the cecum to the  anterior lateral abdominal wall. These adhesions were taken down using laparoscopic scissors with attached cautery. The appendix appeared to be normal however because of her continued pain laparoscopic appendectomy was performed by using a Harmonic Scalpel the mesoappendix and a blue cartridge Endo GIA along the base of the appendix. The appendix was retrieved from the 11 mm cannula and then subsequently inspected for bleeding whether was minimal. All gas and fluid was aspirated from the perineal cavity then we closed.  The fascial stitch at the infra umbilical site was used to close the fascia. We injected cortisone Marcaine with epi at all sites. The infra umbilical skin was closed using a running subcuticular stitch of 4-0 Monocryl. The skin at all sites were closed with Dermabond, Steri-Strips and Tegaderms were used to complete the dressings.  All needle counts, sponge counts and instrument counts were correct.  PATIENT DISPOSITION:  PACU - hemodynamically stable.   Ebrahim Deremer, JAY 12/2/20155:39 PM

## 2014-08-31 NOTE — Anesthesia Preprocedure Evaluation (Addendum)
Anesthesia Evaluation  Patient identified by MRN, date of birth, ID band Patient awake    Reviewed: Allergy & Precautions, H&P , NPO status , Patient's Chart, lab work & pertinent test results  History of Anesthesia Complications Negative for: history of anesthetic complications  Airway Mallampati: II  TM Distance: >3 FB Neck ROM: Full    Dental  (+) Teeth Intact, Dental Advisory Given,    Pulmonary neg pulmonary ROS,  breath sounds clear to auscultation        Cardiovascular negative cardio ROS  Rhythm:Regular Rate:Normal     Neuro/Psych negative neurological ROS  negative psych ROS   GI/Hepatic Neg liver ROS, RLQ pain   Endo/Other  negative endocrine ROS  Renal/GU negative Renal ROS     Musculoskeletal negative musculoskeletal ROS (+)   Abdominal   Peds  Hematology negative hematology ROS (+)   Anesthesia Other Findings   Reproductive/Obstetrics negative OB ROS                            Anesthesia Physical Anesthesia Plan  ASA: I  Anesthesia Plan: General   Post-op Pain Management:    Induction: Intravenous  Airway Management Planned: Oral ETT  Additional Equipment:   Intra-op Plan:   Post-operative Plan: Extubation in OR  Informed Consent: I have reviewed the patients History and Physical, chart, labs and discussed the procedure including the risks, benefits and alternatives for the proposed anesthesia with the patient or authorized representative who has indicated his/her understanding and acceptance.     Plan Discussed with:   Anesthesia Plan Comments:         Anesthesia Quick Evaluation

## 2014-08-31 NOTE — Transfer of Care (Signed)
Immediate Anesthesia Transfer of Care Note  Patient: Ann Howard  Procedure(s) Performed: Procedure(s): LAPAROSCOPY DIAGNOSTIC (N/A) APPENDECTOMY LAPAROSCOPIC (N/A)  Patient Location: PACU  Anesthesia Type:General  Level of Consciousness: awake, alert , oriented and patient cooperative  Airway & Oxygen Therapy: Patient Spontanous Breathing  Post-op Assessment: Report given to PACU RN and Post -op Vital signs reviewed and stable  Post vital signs: Reviewed and stable  Complications: No apparent anesthesia complications

## 2014-08-31 NOTE — H&P (Signed)
Ann Howard is an 23 y.o. female.   Chief Complaint: Right lower quadrant abdominal pain HPI: Patient seen in the ED multiple times with RLQ abdominal pain, work up on each occasion has been negative.  She continues to have RLQ pain associated with low grade fever and nausea.  No chills.  Past Medical History  Diagnosis Date  . Polycystic disease, ovaries     Past Surgical History  Procedure Laterality Date  . Knee surgery Right 12/2005    ACL  . Knee surgery Right 03/2007    History reviewed. No pertinent family history. Social History:  reports that she has never smoked. She has never used smokeless tobacco. She reports that she does not drink alcohol or use illicit drugs.  Allergies:  Allergies  Allergen Reactions  . Articaine Swelling    Septocaine (dental anesthetic) whole face swelled.    Medications Prior to Admission  Medication Sig Dispense Refill  . amoxicillin-clavulanate (AUGMENTIN) 500-125 MG per tablet Take 1 tablet (500 mg total) by mouth 3 (three) times daily. 15 tablet 0  . fluconazole (DIFLUCAN) 150 MG tablet Take 1 tablet (150 mg total) by mouth daily. 1 tablet 1  . HYDROcodone-acetaminophen (NORCO/VICODIN) 5-325 MG per tablet Take 1 tablet by mouth every 4 (four) hours as needed for moderate pain or severe pain. 15 tablet 0  . Multiple Vitamins-Minerals (MULTIVITAMIN WITH MINERALS) tablet Take 1 tablet by mouth daily.    . polyethylene glycol (MIRALAX / GLYCOLAX) packet Take 17 g by mouth daily. 14 each 0  . vitamin C (ASCORBIC ACID) 500 MG tablet Take 500 mg by mouth daily.      Results for orders placed or performed during the hospital encounter of 08/29/14 (from the past 48 hour(s))  Urinalysis, Routine w reflex microscopic     Status: Abnormal   Collection Time: 08/29/14  3:41 PM  Result Value Ref Range   Color, Urine YELLOW YELLOW   APPearance HAZY (A) CLEAR   Specific Gravity, Urine 1.007 1.005 - 1.030   pH 7.5 5.0 - 8.0   Glucose, UA NEGATIVE  NEGATIVE mg/dL   Hgb urine dipstick NEGATIVE NEGATIVE   Bilirubin Urine NEGATIVE NEGATIVE   Ketones, ur NEGATIVE NEGATIVE mg/dL   Protein, ur NEGATIVE NEGATIVE mg/dL   Urobilinogen, UA 0.2 0.0 - 1.0 mg/dL   Nitrite NEGATIVE NEGATIVE   Leukocytes, UA NEGATIVE NEGATIVE    Comment: MICROSCOPIC NOT DONE ON URINES WITH NEGATIVE PROTEIN, BLOOD, LEUKOCYTES, NITRITE, OR GLUCOSE <1000 mg/dL.  POC urine preg, ED [facility])     Status: None   Collection Time: 08/29/14  3:47 PM  Result Value Ref Range   Preg Test, Ur NEGATIVE NEGATIVE    Comment:        THE SENSITIVITY OF THIS METHODOLOGY IS >24 mIU/mL    Ct Abdomen Pelvis W Contrast  08/29/2014   CLINICAL DATA:  Right lower quadrant pain with guarding and nausea.  EXAM: CT ABDOMEN AND PELVIS WITH CONTRAST  TECHNIQUE: Multidetector CT imaging of the abdomen and pelvis was performed using the standard protocol following bolus administration of intravenous contrast.  CONTRAST:  100 mL OMNIPAQUE IOHEXOL 300 MG/ML  SOLN  COMPARISON:  CT abdomen and pelvis 08/27/2014.  FINDINGS: The lung bases are clear.  No pleural or pericardial effusion.  The appendix is well seen and appears normal. The stomach and small and large bowel appear normal. Uterus, adnexa and urinary bladder appear normal.  The liver, gallbladder, adrenal glands, spleen, pancreas, kidneys and  biliary tree each appear normal. There is no lymphadenopathy or fluid. No bony abnormality is identified.  IMPRESSION: Negative for appendicitis.  Negative CT abdomen and pelvis.   Electronically Signed   By: Drusilla Kannerhomas  Dalessio M.D.   On: 08/29/2014 16:08    ROS  Blood pressure 127/84, temperature 98.6 F (37 C), temperature source Oral, resp. rate 18, last menstrual period 08/06/2014, SpO2 98 %. Physical Exam  Constitutional: She is oriented to person, place, and time. She appears well-developed and well-nourished.  HENT:  Head: Normocephalic and atraumatic.  Eyes: Conjunctivae and EOM are normal.  Pupils are equal, round, and reactive to light.  Neck: Normal range of motion. Neck supple.  Cardiovascular: Normal rate.   Respiratory: Effort normal and breath sounds normal.  GI: Soft. Normal appearance. Bowel sounds are decreased. There is tenderness. There is tenderness at McBurney's point. There is no rigidity, no rebound and no guarding.  Musculoskeletal: Normal range of motion.  Neurological: She is alert and oriented to person, place, and time. She has normal reflexes.  Skin: Skin is warm and dry.  Psychiatric: She has a normal mood and affect. Her behavior is normal. Judgment and thought content normal.     Assessment/Plan Acute RLQ abdominal pain with negative w/u with CT x 2.  Will perform diagnostic laparoscopy with appendectomy.  Will look for other possibilities for RLQ pain.  Risks and benefits explained to the patient and her family and they wish to proceed.  [name], JAY 08/31/2014, 2:49 PM

## 2014-09-01 DIAGNOSIS — K3580 Unspecified acute appendicitis: Secondary | ICD-10-CM | POA: Diagnosis not present

## 2014-09-01 MED ORDER — OXYCODONE-ACETAMINOPHEN 5-325 MG PO TABS: 1.0000 | ORAL_TABLET | ORAL | Status: AC | PRN

## 2014-09-01 MED ORDER — ONDANSETRON HCL 4 MG PO TABS: 4.0000 mg | ORAL_TABLET | Freq: Four times a day (QID) | ORAL | Status: AC | PRN

## 2014-09-01 NOTE — Progress Notes (Signed)
Patient discharged to home with instructions. 

## 2014-09-01 NOTE — Discharge Instructions (Signed)
Laparoscopic Appendectomy °Care After °Refer to this sheet in the next few weeks. These instructions provide you with information on caring for yourself after your procedure. Your caregiver may also give you more specific instructions. Your treatment has been planned according to current medical practices, but problems sometimes occur. Call your caregiver if you have any problems or questions after your procedure. °HOME CARE INSTRUCTIONS °· Do not drive while taking narcotic pain medicines. °· Use stool softener if you become constipated from your pain medicines. °· Change your bandages (dressings) as directed. °· Keep your wounds clean and dry. You may wash the wounds gently with soap and water. Gently pat the wounds dry with a clean towel. °· Do not take baths, swim, or use hot tubs for 10 days, or as instructed by your caregiver. °· Only take over-the-counter or prescription medicines for pain, discomfort, or fever as directed by your caregiver. °· You may continue your normal diet as directed. °· Do not lift more than 10 pounds (4.5 kg) or play contact sports for 3 weeks, or as directed. °· Slowly increase your activity after surgery. °· Take deep breaths to avoid getting a lung infection (pneumonia). °SEEK MEDICAL CARE IF: °· You have redness, swelling, or increasing pain in your wounds. °· You have pus coming from your wounds. °· You have drainage from a wound that lasts longer than 1 day. °· You notice a bad smell coming from the wounds or dressing. °· Your wound edges break open after stitches (sutures) have been removed. °· You notice increasing pain in the shoulders (shoulder strap areas) or near your shoulder blades. °· You develop dizzy episodes or fainting while standing. °· You develop shortness of breath. °· You develop persistent nausea or vomiting. °· You cannot control your bowel functions or lose your appetite. °· You develop diarrhea. °SEEK IMMEDIATE MEDICAL CARE IF:  °· You have a fever. °· You  develop a rash. °· You have difficulty breathing or sharp pains in your chest. °· You develop any reaction or side effects to medicines given. °MAKE SURE YOU: °· Understand these instructions. °· Will watch your condition. °· Will get help right away if you are not doing well or get worse. °Document Released: 09/16/2005 Document Revised: 12/09/2011 Document Reviewed: 03/26/2011 °[employer]® Patient Information ©2015 [employer]. This information is not intended to replace advice given to you by your health care provider. Make sure you discuss any questions you have with your health care provider. ° °

## 2014-09-01 NOTE — Discharge Summary (Signed)
Physician Discharge Summary  Patient ID: Ann Howard MRN: [id number] DOB/AGE: Jan 20, 1991 23 y.o.  Admit date: 08/31/2014 Discharge date: 09/01/2014  Admission Diagnoses:  Discharge Diagnoses:  Active Problems:   Acute appendicitis   Discharged Condition: good  Hospital Course: Admitted after laparoscopic appendectomy and diagnostic laparoscopy done for persistent RLQ abdominal pain with negative preoperative workup.  Consulted with the patient who agreed that further tests were not necessarily going to work. Laparoscopy with appendectomy recommended.  Consults: None  Significant Diagnostic Studies: None  Treatments: IV hydration and antibiotics: preoperative Cefoxitin  Discharge Exam: Blood pressure 108/67, pulse 56, temperature 97.7 F (36.5 C), temperature source Oral, resp. rate 15, last menstrual period 08/06/2014, SpO2 98 %. General appearance: alert, cooperative, appears stated age and no distress Resp: clear to auscultation bilaterally GI: soft, non-tender; bowel sounds normal; no masses,  no organomegaly  Disposition: 01-Home or Self Care      Discharge Instructions    Call MD for:  difficulty breathing, headache or visual disturbances    Complete by:  As directed      Call MD for:  hives    Complete by:  As directed      Call MD for:  persistant dizziness or light-headedness    Complete by:  As directed      Call MD for:  persistant nausea and vomiting    Complete by:  As directed      Call MD for:  redness, tenderness, or signs of infection (pain, swelling, redness, odor or green/yellow discharge around incision site)    Complete by:  As directed      Call MD for:  severe uncontrolled pain    Complete by:  As directed      Call MD for:  temperature >100.4    Complete by:  As directed      Diet general    Complete by:  As directed      Discharge instructions    Complete by:  As directed   See specific instructions for postop appendectomy.     Driving  Restrictions    Complete by:  As directed   2-3 days     Increase activity slowly    Complete by:  As directed      Leave dressing on - Keep it clean, dry, and intact until clinic visit    Complete by:  As directed      Lifting restrictions    Complete by:  As directed   No lifting > 25 pounds for the next 3 weeks.     Sexual Activity Restrictions    Complete by:  As directed   Next 2 weeks or until pain resolved            Medication List    STOP taking these medications        amoxicillin-clavulanate 500-125 MG per tablet  Commonly known as:  AUGMENTIN     HYDROcodone-acetaminophen 5-325 MG per tablet  Commonly known as:  NORCO/VICODIN      TAKE these medications        fluconazole 150 MG tablet  Commonly known as:  DIFLUCAN  Take 1 tablet (150 mg total) by mouth daily.     multivitamin with minerals tablet  Take 1 tablet by mouth daily.     ondansetron 4 MG tablet  Commonly known as:  ZOFRAN  Take 1 tablet (4 mg total) by mouth every 6 (six) hours as needed for nausea.  oxyCODONE-acetaminophen 5-325 MG per tablet  Commonly known as:  PERCOCET/ROXICET  Take 1-2 tablets by mouth every 4 (four) hours as needed for moderate pain.     polyethylene glycol packet  Commonly known as:  MIRALAX / GLYCOLAX  Take 17 g by mouth daily.     vitamin C 500 MG tablet  Commonly known as:  ASCORBIC ACID  Take 500 mg by mouth daily.       Follow-up Information    Follow up with Hoang Reich, JAY, MD. Schedule an appointment as soon as possible for a visit in 2 weeks.   Specialty:  General Surgery   Contact information:   [address], [address]  [facility] [place] [address] [phone]       Signed: Frederik SchmidtWYATT, JAY 09/01/2014, 7:55 AM

## 2014-09-02 ENCOUNTER — Encounter (HOSPITAL_COMMUNITY): Payer: Self-pay | Admitting: General Surgery

## 2015-10-12 DIAGNOSIS — M5441 Lumbago with sciatica, right side: Secondary | ICD-10-CM | POA: Diagnosis not present

## 2015-10-12 DIAGNOSIS — M9904 Segmental and somatic dysfunction of sacral region: Secondary | ICD-10-CM | POA: Diagnosis not present

## 2015-10-12 DIAGNOSIS — M531 Cervicobrachial syndrome: Secondary | ICD-10-CM | POA: Diagnosis not present

## 2015-10-12 DIAGNOSIS — M9901 Segmental and somatic dysfunction of cervical region: Secondary | ICD-10-CM | POA: Diagnosis not present

## 2015-10-12 DIAGNOSIS — M791 Myalgia: Secondary | ICD-10-CM | POA: Diagnosis not present

## 2015-10-12 DIAGNOSIS — M9902 Segmental and somatic dysfunction of thoracic region: Secondary | ICD-10-CM | POA: Diagnosis not present

## 2015-10-12 DIAGNOSIS — M9903 Segmental and somatic dysfunction of lumbar region: Secondary | ICD-10-CM | POA: Diagnosis not present

## 2015-10-15 IMAGING — CT CT ABD-PELV W/ CM
2 of 4 series · 16 of 46 positions shown, 18 images · IV contrast (Omni 300)
Comparison: None.

CLINICAL DATA: Abdominal pain, right lower quadrant pain since
[REDACTED]

EXAM:
CT ABDOMEN AND PELVIS WITH CONTRAST
TECHNIQUE: Multidetector CT imaging of the abdomen and pelvis was performed
using the standard protocol following bolus administration of
intravenous contrast.
CONTRAST:  100mL OMNIPAQUE IOHEXOL 300 MG/ML  SOLN

[Series 2: abd/ pelvis 5.0 i30f 1 · axial · 0.77mm/px · z∈[-999,-564]mm · 13 of 95 slices shown, 15 images]
[im 4/95  soft-tissue]
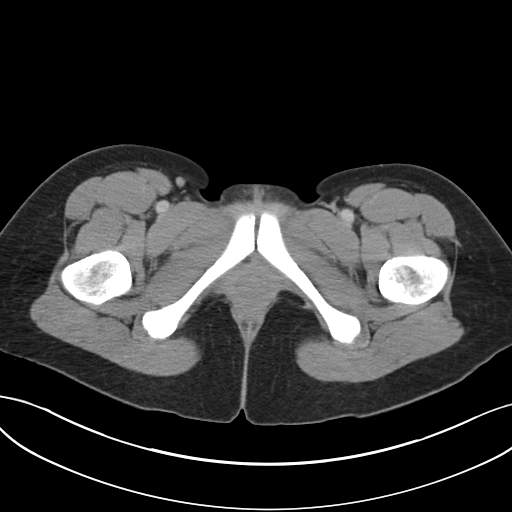
[im 4/95  bone]
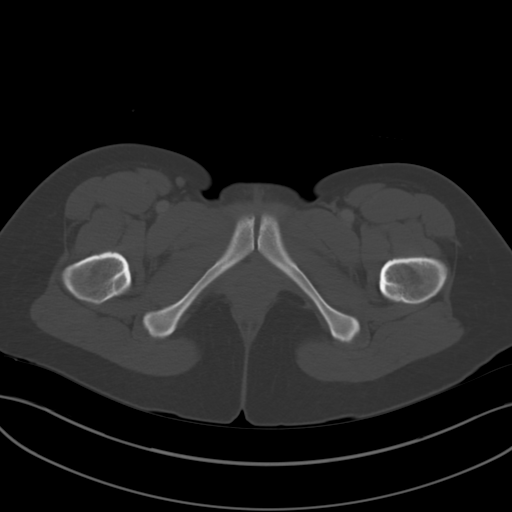
[im 12/95  soft-tissue]
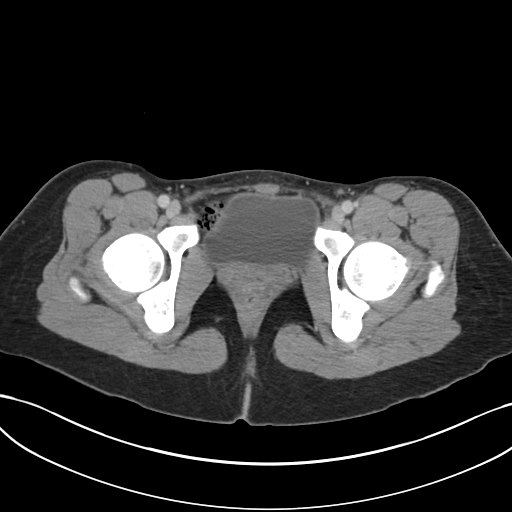
[im 20/95  soft-tissue]
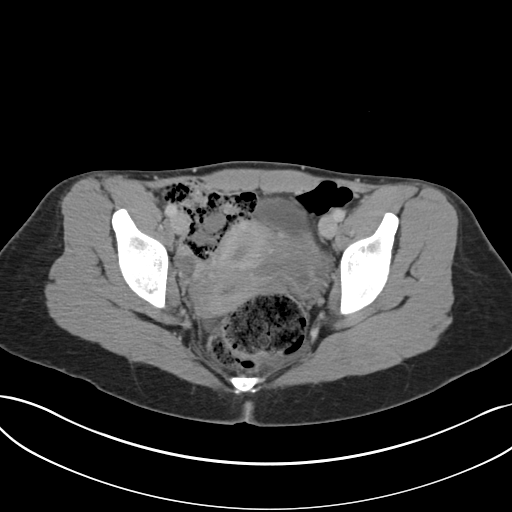
[im 28/95  soft-tissue]
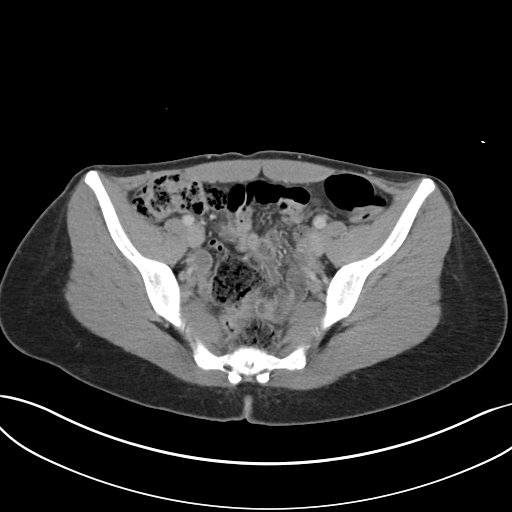
[im 32/95  soft-tissue]
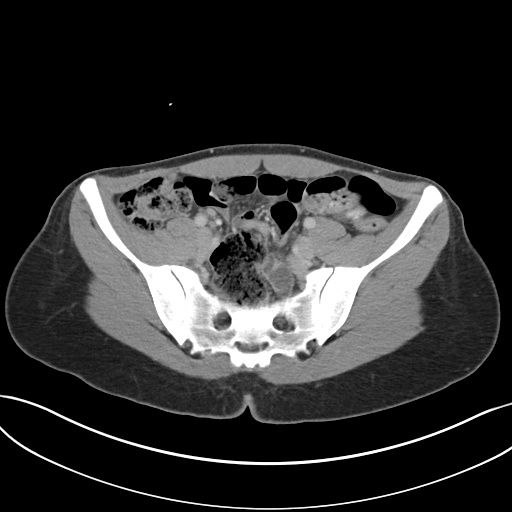
[im 40/95  soft-tissue]
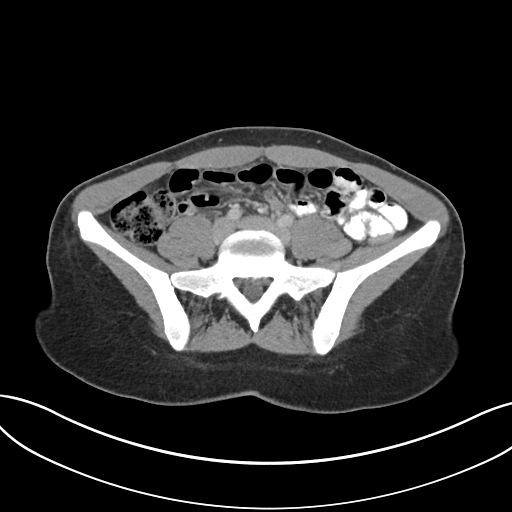
[im 48/95  soft-tissue]
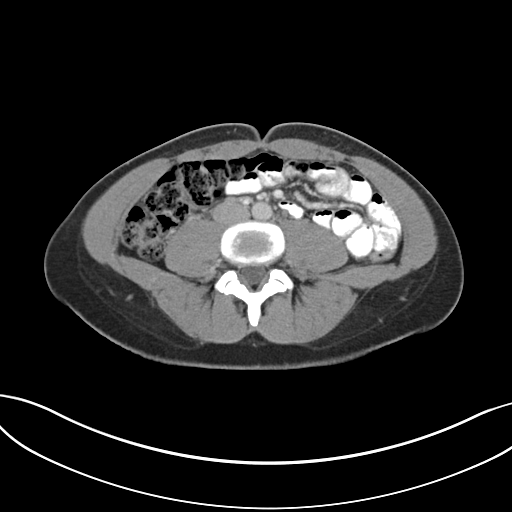
[im 55/95  soft-tissue]
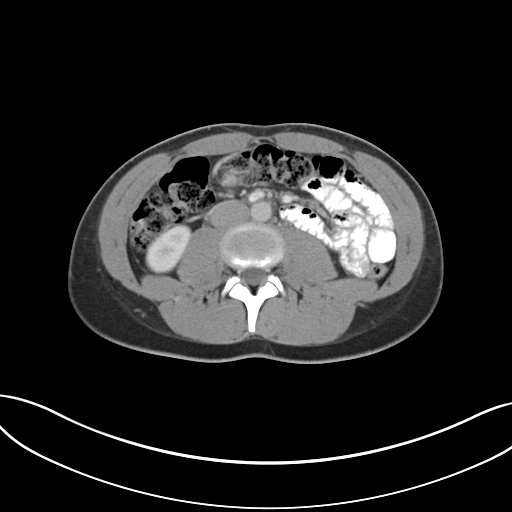
[im 63/95  soft-tissue]
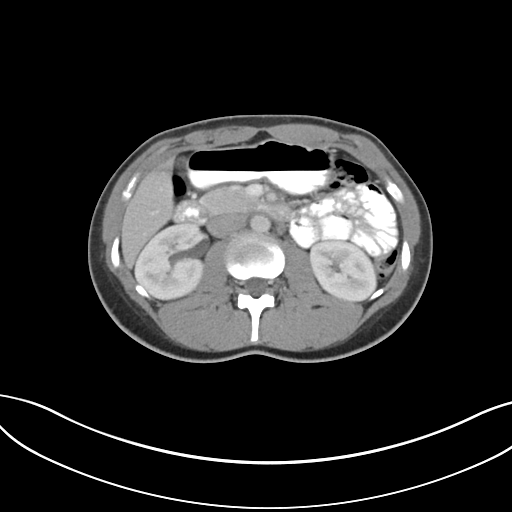
[im 63/95  bone]
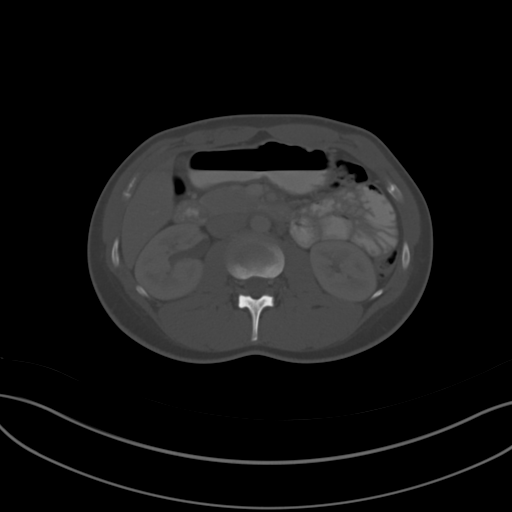
[im 67/95  soft-tissue]
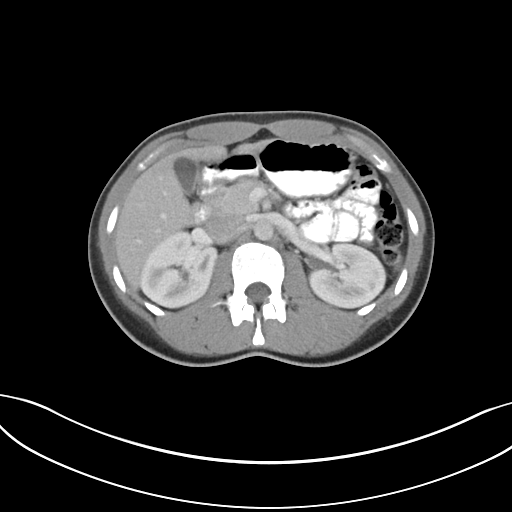
[im 75/95  soft-tissue]
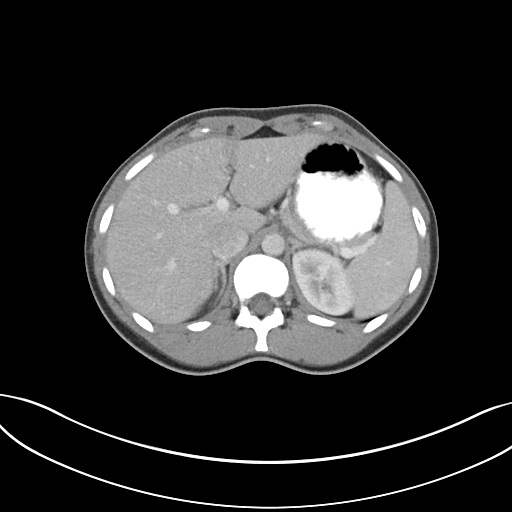
[im 83/95  soft-tissue]
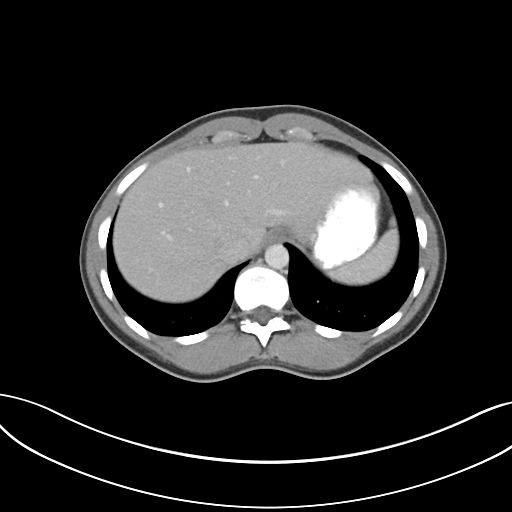
[im 91/95  soft-tissue]
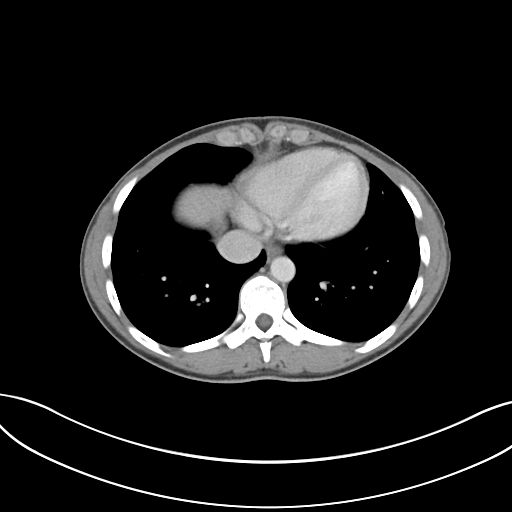

[Series 5: coronals · coronal · 0.80mm/px · 3 of 99 slices shown]
[im 33/99  soft-tissue]
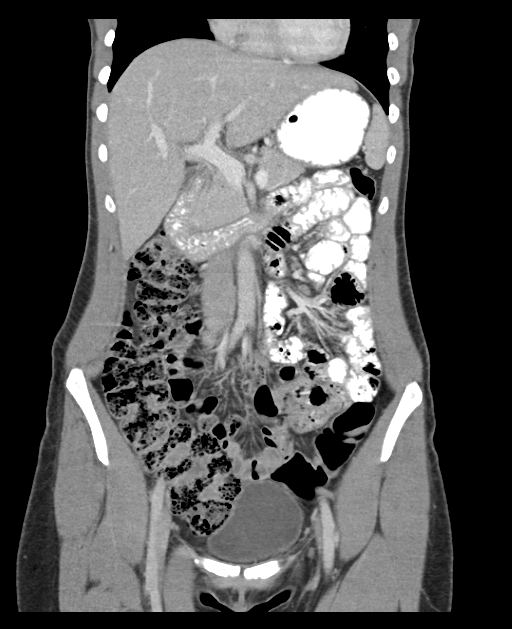
[im 44/99  soft-tissue]
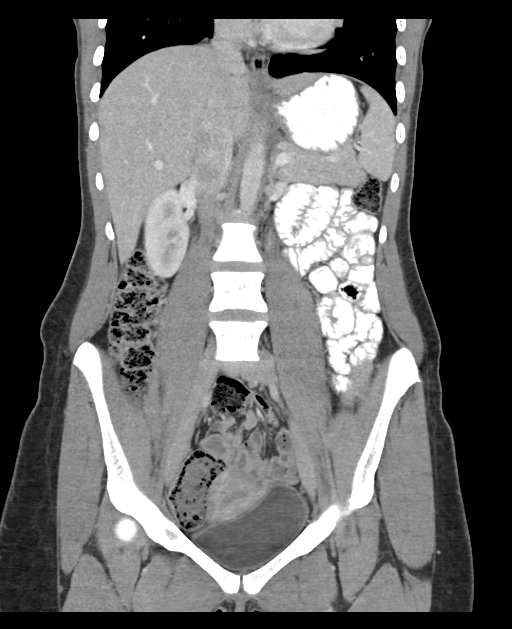
[im 55/99  soft-tissue]
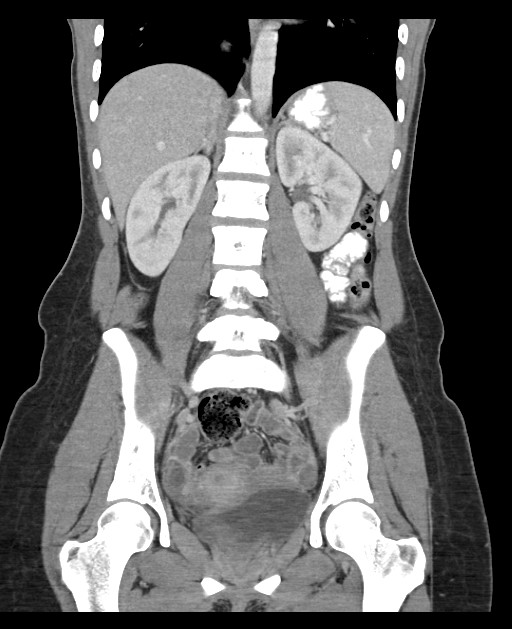

[16 of 46 positions shown; findings below may reference images not displayed]

FINDINGS: Lung bases are unremarkable. Sagittal images of the spine are
unremarkable.

Enhanced liver is unremarkable. The pancreas, spleen and adrenal
glands are unremarkable. Abdominal aorta is unremarkable.

No small bowel obstruction. Abundant stool noted in right colon and
transverse colon. There is no pericecal inflammation. There is a low
lying cecum. The tip of the cecum is in right lower anterior pelvis
just lateral to urinary bladder. Normal appendix retrocecal position
clearly visualized axial image 71. Small amount of nonspecific fluid
is noted within uterus. No adnexal mass is noted. Significant stool
noted in sigmoid colon and rectum. The rectum is distended with
stool up to 6.5 cm in diameter suspicious for mild fecal impaction.

Kidneys are symmetrical in size and enhancement. No hydronephrosis
or hydroureter. No destructive bony lesions are noted within pelvis.
No inguinal adenopathy.
IMPRESSION: 1. No pericecal inflammation. Abundant stool noted in right colon
and transverse colon. There is a low lying cecum. Normal appendix
clearly visualized.
2. No hydronephrosis or hydroureter.
3. Significant stool noted within rectum measures 6.5 cm in diameter
suspicious for mild fecal impaction.
4. No adnexal mass.
5. No small bowel obstruction.

## 2015-10-15 IMAGING — US US ART/VEN ABD/PELV/SCROTUM DOPPLER LTD
1 series · 13 of 25 positions shown · non-contrast
Comparison: CT scan dated 08/27/2014

CLINICAL DATA: Right lower quadrant pain.

EXAM:
TRANSABDOMINAL AND TRANSVAGINAL ULTRASOUND OF PELVIS
DOPPLER ULTRASOUND OF OVARIES
TECHNIQUE: Both transabdominal and transvaginal ultrasound examinations of the
pelvis were performed. Transabdominal technique was performed for
global imaging of the pelvis including uterus, ovaries, adnexal
regions, and pelvic cul-de-sac.
It was necessary to proceed with endovaginal exam following the
transabdominal exam to visualize the ovaries and adnexa. Color and
duplex Doppler ultrasound was utilized to evaluate blood flow to the
ovaries.

[Series 1: us art/ven abd/pelv/scrotum doppler ltd · 0.20mm/px · 13 of 66 slices shown]
[im 1/66]
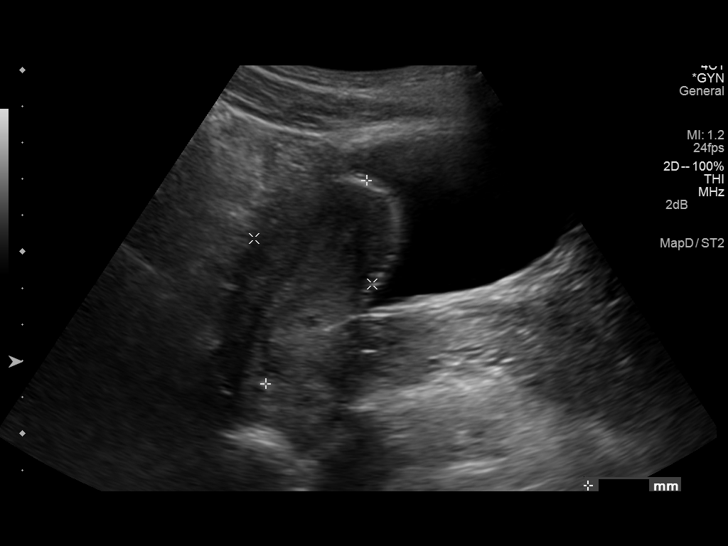
[im 6/66]
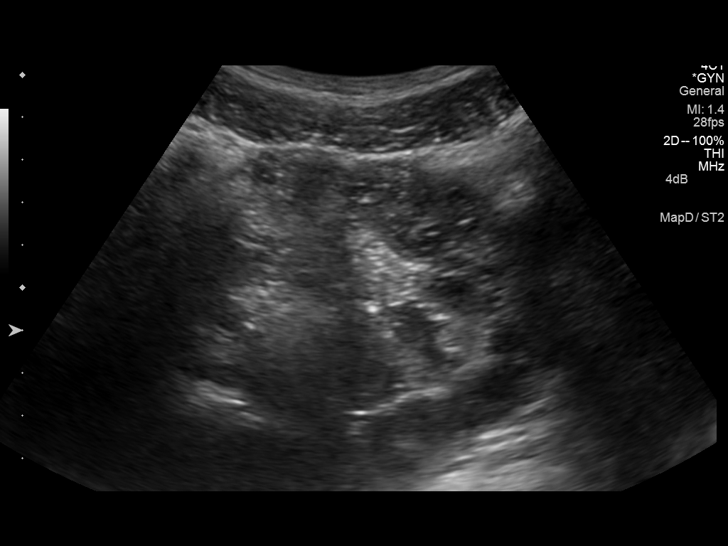
[im 11/66]
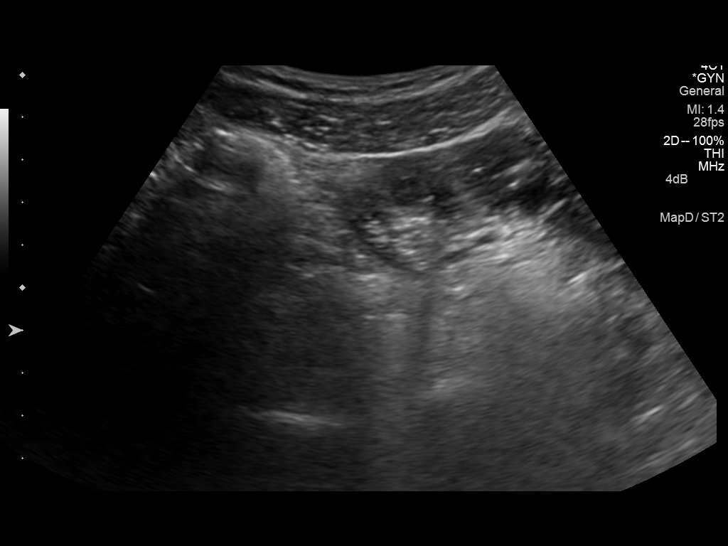
[im 17/66]
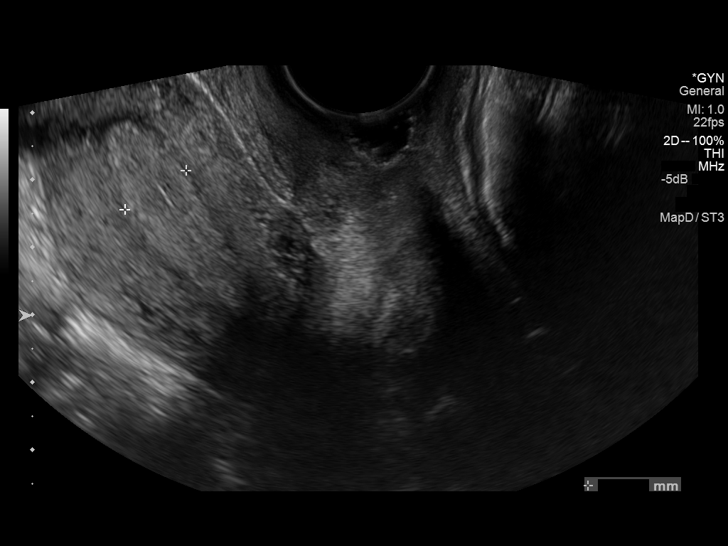
[im 22/66]
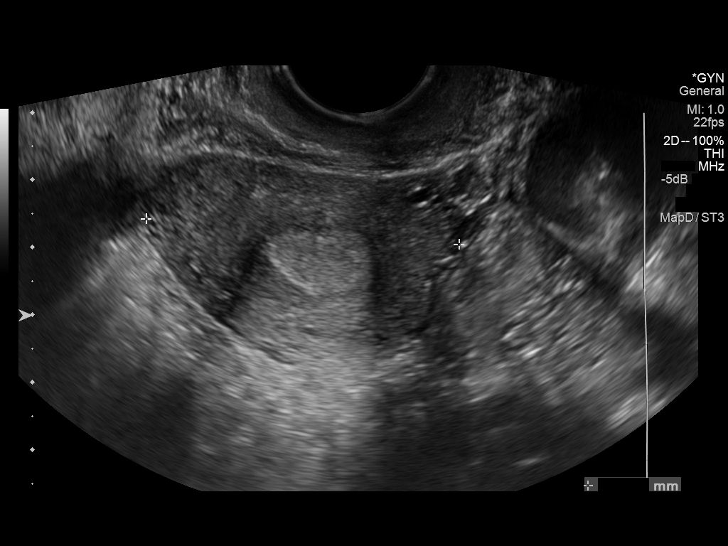
[im 28/66]
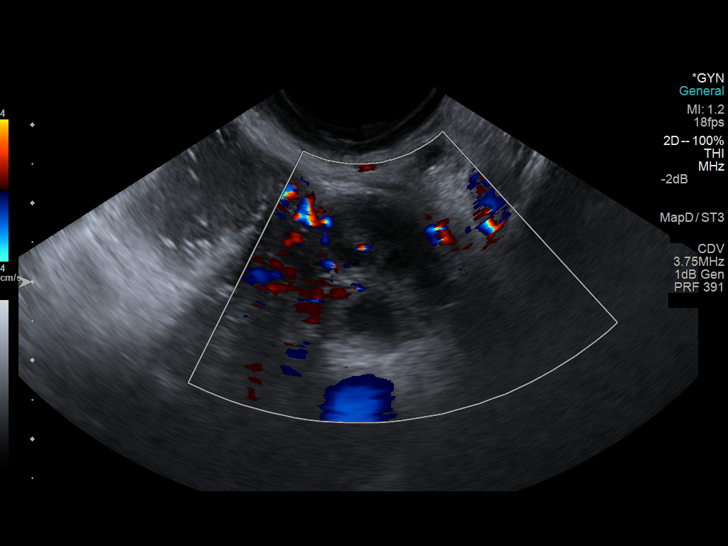
[im 33/66]
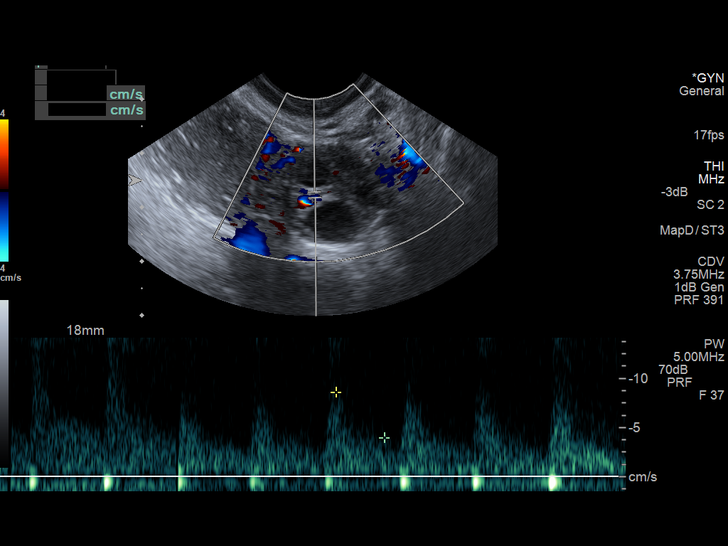
[im 38/66]
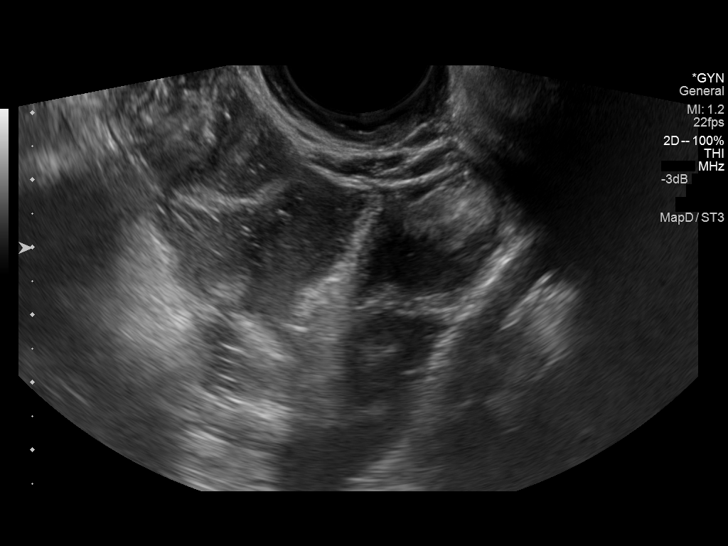
[im 44/66]
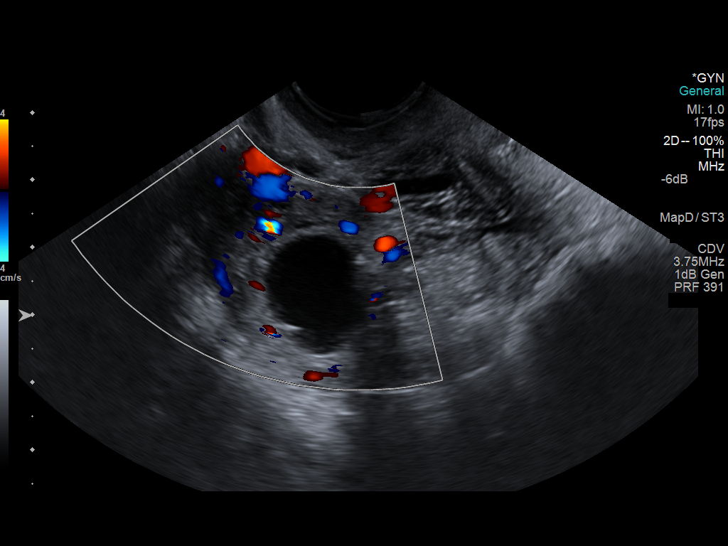
[im 49/66]
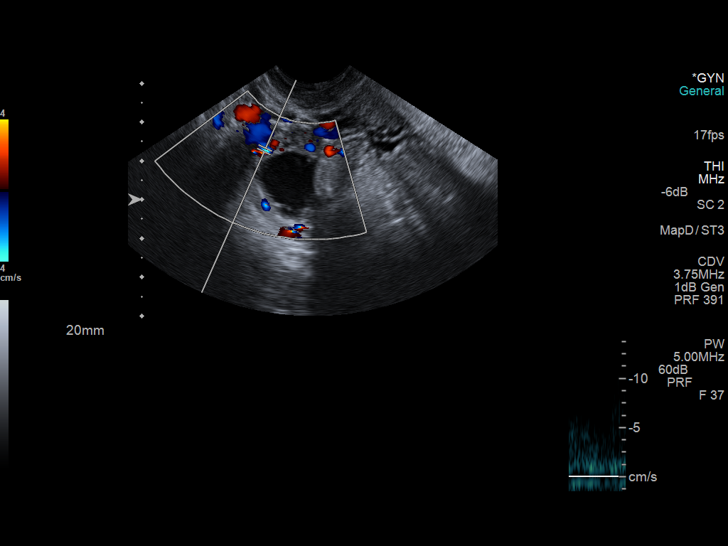
[im 55/66]
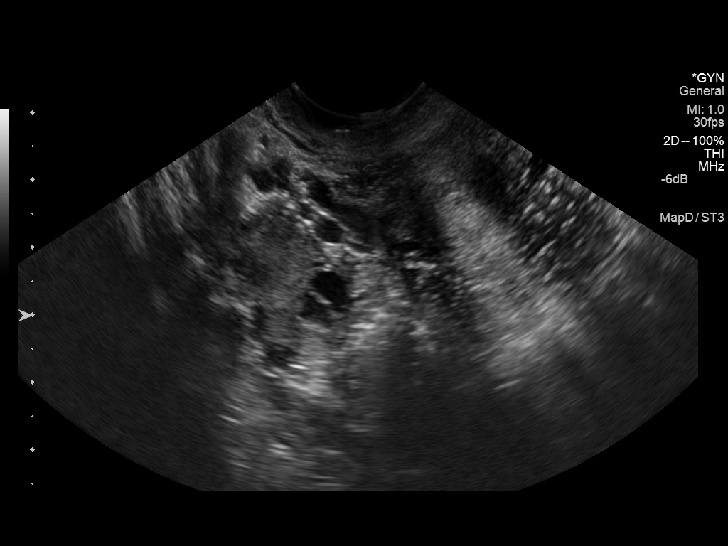
[im 60/66]
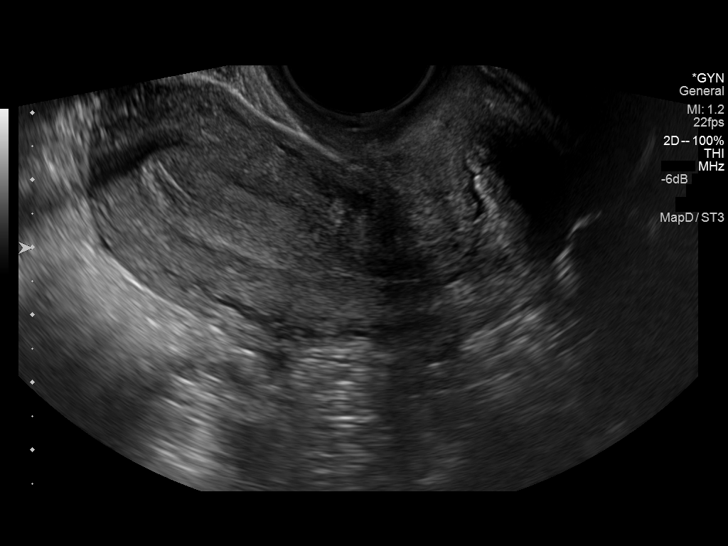
[im 66/66]
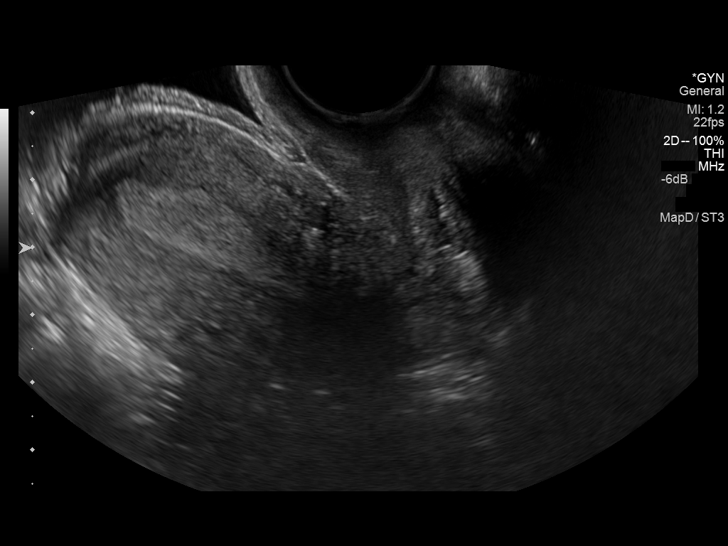

[13 of 25 positions shown; findings below may reference images not displayed]

FINDINGS: Uterus

Measurements: 6.0 x 3.4 x 4.7 cm. No fibroids or other mass
visualized.

Endometrium

Thickness: 10.8 mm.  No focal abnormality visualized.

Right ovary

Measurements: 3.8 x 2.7 x 2.3 cm. Normal appearance/no adnexal mass.
Normal follicles.

Left ovary

Measurements: 2.5 x 2.1 x 1.8 cm. Normal appearance/no adnexal mass.
Normal follicles.

Pulsed Doppler evaluation of both ovaries demonstrates normal
low-resistance arterial and venous waveforms.

Other findings

Trace free fluid in the pelvis.
IMPRESSION: Normal exam.  Normal appearing ovaries.

## 2015-10-19 MED FILL — ELINEST-28 TABLET: 0.3-30 | 84 days supply | Qty: 84 | Fill #4

## 2015-10-23 DIAGNOSIS — M9903 Segmental and somatic dysfunction of lumbar region: Secondary | ICD-10-CM | POA: Diagnosis not present

## 2015-10-23 DIAGNOSIS — M9904 Segmental and somatic dysfunction of sacral region: Secondary | ICD-10-CM | POA: Diagnosis not present

## 2015-10-23 DIAGNOSIS — M791 Myalgia: Secondary | ICD-10-CM | POA: Diagnosis not present

## 2015-10-23 DIAGNOSIS — M9901 Segmental and somatic dysfunction of cervical region: Secondary | ICD-10-CM | POA: Diagnosis not present

## 2015-10-23 DIAGNOSIS — M531 Cervicobrachial syndrome: Secondary | ICD-10-CM | POA: Diagnosis not present

## 2015-10-23 DIAGNOSIS — M9902 Segmental and somatic dysfunction of thoracic region: Secondary | ICD-10-CM | POA: Diagnosis not present

## 2015-10-23 DIAGNOSIS — M5441 Lumbago with sciatica, right side: Secondary | ICD-10-CM | POA: Diagnosis not present

## 2015-10-25 DIAGNOSIS — M9902 Segmental and somatic dysfunction of thoracic region: Secondary | ICD-10-CM | POA: Diagnosis not present

## 2015-10-25 DIAGNOSIS — M5441 Lumbago with sciatica, right side: Secondary | ICD-10-CM | POA: Diagnosis not present

## 2015-10-25 DIAGNOSIS — M9901 Segmental and somatic dysfunction of cervical region: Secondary | ICD-10-CM | POA: Diagnosis not present

## 2015-10-25 DIAGNOSIS — M531 Cervicobrachial syndrome: Secondary | ICD-10-CM | POA: Diagnosis not present

## 2015-10-25 DIAGNOSIS — M9904 Segmental and somatic dysfunction of sacral region: Secondary | ICD-10-CM | POA: Diagnosis not present

## 2015-10-25 DIAGNOSIS — M9903 Segmental and somatic dysfunction of lumbar region: Secondary | ICD-10-CM | POA: Diagnosis not present

## 2015-10-25 DIAGNOSIS — M791 Myalgia: Secondary | ICD-10-CM | POA: Diagnosis not present

## 2015-11-09 DIAGNOSIS — M9903 Segmental and somatic dysfunction of lumbar region: Secondary | ICD-10-CM | POA: Diagnosis not present

## 2015-11-09 DIAGNOSIS — M9901 Segmental and somatic dysfunction of cervical region: Secondary | ICD-10-CM | POA: Diagnosis not present

## 2015-11-09 DIAGNOSIS — M5441 Lumbago with sciatica, right side: Secondary | ICD-10-CM | POA: Diagnosis not present

## 2015-11-09 DIAGNOSIS — M531 Cervicobrachial syndrome: Secondary | ICD-10-CM | POA: Diagnosis not present

## 2015-11-09 DIAGNOSIS — M9904 Segmental and somatic dysfunction of sacral region: Secondary | ICD-10-CM | POA: Diagnosis not present

## 2015-11-09 DIAGNOSIS — M791 Myalgia: Secondary | ICD-10-CM | POA: Diagnosis not present

## 2015-11-09 DIAGNOSIS — M9902 Segmental and somatic dysfunction of thoracic region: Secondary | ICD-10-CM | POA: Diagnosis not present

## 2015-12-06 DIAGNOSIS — M9902 Segmental and somatic dysfunction of thoracic region: Secondary | ICD-10-CM | POA: Diagnosis not present

## 2015-12-06 DIAGNOSIS — M9903 Segmental and somatic dysfunction of lumbar region: Secondary | ICD-10-CM | POA: Diagnosis not present

## 2015-12-06 DIAGNOSIS — M791 Myalgia: Secondary | ICD-10-CM | POA: Diagnosis not present

## 2015-12-06 DIAGNOSIS — M9904 Segmental and somatic dysfunction of sacral region: Secondary | ICD-10-CM | POA: Diagnosis not present

## 2015-12-06 DIAGNOSIS — M9901 Segmental and somatic dysfunction of cervical region: Secondary | ICD-10-CM | POA: Diagnosis not present

## 2015-12-06 DIAGNOSIS — M5441 Lumbago with sciatica, right side: Secondary | ICD-10-CM | POA: Diagnosis not present

## 2015-12-06 DIAGNOSIS — M531 Cervicobrachial syndrome: Secondary | ICD-10-CM | POA: Diagnosis not present

## 2016-01-04 DIAGNOSIS — M9902 Segmental and somatic dysfunction of thoracic region: Secondary | ICD-10-CM | POA: Diagnosis not present

## 2016-01-04 DIAGNOSIS — M9901 Segmental and somatic dysfunction of cervical region: Secondary | ICD-10-CM | POA: Diagnosis not present

## 2016-01-04 DIAGNOSIS — M9904 Segmental and somatic dysfunction of sacral region: Secondary | ICD-10-CM | POA: Diagnosis not present

## 2016-01-04 DIAGNOSIS — M791 Myalgia: Secondary | ICD-10-CM | POA: Diagnosis not present

## 2016-01-04 DIAGNOSIS — M9903 Segmental and somatic dysfunction of lumbar region: Secondary | ICD-10-CM | POA: Diagnosis not present

## 2016-01-04 DIAGNOSIS — M531 Cervicobrachial syndrome: Secondary | ICD-10-CM | POA: Diagnosis not present

## 2016-01-04 DIAGNOSIS — M5441 Lumbago with sciatica, right side: Secondary | ICD-10-CM | POA: Diagnosis not present

## 2016-01-11 MED FILL — ELINEST-28 TABLET: 0.3-30 | 28 days supply | Qty: 28 | Fill #5

## 2016-02-01 DIAGNOSIS — M791 Myalgia: Secondary | ICD-10-CM | POA: Diagnosis not present

## 2016-02-01 DIAGNOSIS — M5441 Lumbago with sciatica, right side: Secondary | ICD-10-CM | POA: Diagnosis not present

## 2016-02-01 DIAGNOSIS — M9903 Segmental and somatic dysfunction of lumbar region: Secondary | ICD-10-CM | POA: Diagnosis not present

## 2016-02-01 DIAGNOSIS — M9901 Segmental and somatic dysfunction of cervical region: Secondary | ICD-10-CM | POA: Diagnosis not present

## 2016-02-01 DIAGNOSIS — M9902 Segmental and somatic dysfunction of thoracic region: Secondary | ICD-10-CM | POA: Diagnosis not present

## 2016-02-01 DIAGNOSIS — M531 Cervicobrachial syndrome: Secondary | ICD-10-CM | POA: Diagnosis not present

## 2016-02-01 DIAGNOSIS — M9904 Segmental and somatic dysfunction of sacral region: Secondary | ICD-10-CM | POA: Diagnosis not present

## 2016-02-09 MED FILL — ELINEST-28 TABLET: 0.3-30 | 28 days supply | Qty: 28 | Fill #0

## 2016-02-10 DIAGNOSIS — M9902 Segmental and somatic dysfunction of thoracic region: Secondary | ICD-10-CM | POA: Diagnosis not present

## 2016-02-10 DIAGNOSIS — M9904 Segmental and somatic dysfunction of sacral region: Secondary | ICD-10-CM | POA: Diagnosis not present

## 2016-02-10 DIAGNOSIS — M5441 Lumbago with sciatica, right side: Secondary | ICD-10-CM | POA: Diagnosis not present

## 2016-02-10 DIAGNOSIS — M9901 Segmental and somatic dysfunction of cervical region: Secondary | ICD-10-CM | POA: Diagnosis not present

## 2016-02-10 DIAGNOSIS — M9903 Segmental and somatic dysfunction of lumbar region: Secondary | ICD-10-CM | POA: Diagnosis not present

## 2016-02-10 DIAGNOSIS — M791 Myalgia: Secondary | ICD-10-CM | POA: Diagnosis not present

## 2016-02-10 DIAGNOSIS — M531 Cervicobrachial syndrome: Secondary | ICD-10-CM | POA: Diagnosis not present

## 2016-02-28 DIAGNOSIS — M9901 Segmental and somatic dysfunction of cervical region: Secondary | ICD-10-CM | POA: Diagnosis not present

## 2016-02-28 DIAGNOSIS — M9903 Segmental and somatic dysfunction of lumbar region: Secondary | ICD-10-CM | POA: Diagnosis not present

## 2016-02-28 DIAGNOSIS — M531 Cervicobrachial syndrome: Secondary | ICD-10-CM | POA: Diagnosis not present

## 2016-02-28 DIAGNOSIS — M9904 Segmental and somatic dysfunction of sacral region: Secondary | ICD-10-CM | POA: Diagnosis not present

## 2016-02-28 DIAGNOSIS — M791 Myalgia: Secondary | ICD-10-CM | POA: Diagnosis not present

## 2016-02-28 DIAGNOSIS — M5441 Lumbago with sciatica, right side: Secondary | ICD-10-CM | POA: Diagnosis not present

## 2016-02-28 DIAGNOSIS — M9902 Segmental and somatic dysfunction of thoracic region: Secondary | ICD-10-CM | POA: Diagnosis not present

## 2016-03-15 MED FILL — ELINEST-28 TABLET: 0.3-30 | 28 days supply | Qty: 28 | Fill #0

## 2016-03-19 DIAGNOSIS — M791 Myalgia: Secondary | ICD-10-CM | POA: Diagnosis not present

## 2016-03-19 DIAGNOSIS — M9904 Segmental and somatic dysfunction of sacral region: Secondary | ICD-10-CM | POA: Diagnosis not present

## 2016-03-19 DIAGNOSIS — M531 Cervicobrachial syndrome: Secondary | ICD-10-CM | POA: Diagnosis not present

## 2016-03-19 DIAGNOSIS — Z01419 Encounter for gynecological examination (general) (routine) without abnormal findings: Secondary | ICD-10-CM | POA: Diagnosis not present

## 2016-03-19 DIAGNOSIS — M5441 Lumbago with sciatica, right side: Secondary | ICD-10-CM | POA: Diagnosis not present

## 2016-03-19 DIAGNOSIS — M9903 Segmental and somatic dysfunction of lumbar region: Secondary | ICD-10-CM | POA: Diagnosis not present

## 2016-03-19 DIAGNOSIS — Z6824 Body mass index (BMI) 24.0-24.9, adult: Secondary | ICD-10-CM | POA: Diagnosis not present

## 2016-03-19 DIAGNOSIS — M9901 Segmental and somatic dysfunction of cervical region: Secondary | ICD-10-CM | POA: Diagnosis not present

## 2016-03-19 DIAGNOSIS — M9902 Segmental and somatic dysfunction of thoracic region: Secondary | ICD-10-CM | POA: Diagnosis not present

## 2016-03-28 DIAGNOSIS — M791 Myalgia: Secondary | ICD-10-CM | POA: Diagnosis not present

## 2016-03-28 DIAGNOSIS — M9902 Segmental and somatic dysfunction of thoracic region: Secondary | ICD-10-CM | POA: Diagnosis not present

## 2016-03-28 DIAGNOSIS — M9901 Segmental and somatic dysfunction of cervical region: Secondary | ICD-10-CM | POA: Diagnosis not present

## 2016-03-28 DIAGNOSIS — M9904 Segmental and somatic dysfunction of sacral region: Secondary | ICD-10-CM | POA: Diagnosis not present

## 2016-03-28 DIAGNOSIS — M531 Cervicobrachial syndrome: Secondary | ICD-10-CM | POA: Diagnosis not present

## 2016-03-28 DIAGNOSIS — M5441 Lumbago with sciatica, right side: Secondary | ICD-10-CM | POA: Diagnosis not present

## 2016-03-28 DIAGNOSIS — M9903 Segmental and somatic dysfunction of lumbar region: Secondary | ICD-10-CM | POA: Diagnosis not present

## 2016-04-05 DIAGNOSIS — M25461 Effusion, right knee: Secondary | ICD-10-CM | POA: Diagnosis not present

## 2016-04-10 MED FILL — ELINEST-28 TABLET: 0.3-30 | 84 days supply | Qty: 84 | Fill #0

## 2016-04-25 DIAGNOSIS — M531 Cervicobrachial syndrome: Secondary | ICD-10-CM | POA: Diagnosis not present

## 2016-04-25 DIAGNOSIS — M791 Myalgia: Secondary | ICD-10-CM | POA: Diagnosis not present

## 2016-04-25 DIAGNOSIS — M9903 Segmental and somatic dysfunction of lumbar region: Secondary | ICD-10-CM | POA: Diagnosis not present

## 2016-04-25 DIAGNOSIS — M9901 Segmental and somatic dysfunction of cervical region: Secondary | ICD-10-CM | POA: Diagnosis not present

## 2016-04-25 DIAGNOSIS — M9902 Segmental and somatic dysfunction of thoracic region: Secondary | ICD-10-CM | POA: Diagnosis not present

## 2016-04-25 DIAGNOSIS — M5441 Lumbago with sciatica, right side: Secondary | ICD-10-CM | POA: Diagnosis not present

## 2016-04-25 DIAGNOSIS — M9904 Segmental and somatic dysfunction of sacral region: Secondary | ICD-10-CM | POA: Diagnosis not present

## 2016-05-14 DIAGNOSIS — M25461 Effusion, right knee: Secondary | ICD-10-CM | POA: Diagnosis not present

## 2016-05-14 DIAGNOSIS — M25361 Other instability, right knee: Secondary | ICD-10-CM | POA: Diagnosis not present

## 2016-05-17 DIAGNOSIS — M25561 Pain in right knee: Secondary | ICD-10-CM | POA: Diagnosis not present

## 2016-05-21 DIAGNOSIS — M25461 Effusion, right knee: Secondary | ICD-10-CM | POA: Diagnosis not present

## 2016-05-21 DIAGNOSIS — M25361 Other instability, right knee: Secondary | ICD-10-CM | POA: Diagnosis not present

## 2016-05-24 DIAGNOSIS — M6588 Other synovitis and tenosynovitis, other site: Secondary | ICD-10-CM | POA: Diagnosis not present

## 2016-05-24 DIAGNOSIS — M2342 Loose body in knee, left knee: Secondary | ICD-10-CM | POA: Diagnosis not present

## 2016-05-24 DIAGNOSIS — M794 Hypertrophy of (infrapatellar) fat pad: Secondary | ICD-10-CM | POA: Diagnosis not present

## 2016-05-24 DIAGNOSIS — L905 Scar conditions and fibrosis of skin: Secondary | ICD-10-CM | POA: Diagnosis not present

## 2016-05-24 DIAGNOSIS — M948X6 Other specified disorders of cartilage, lower leg: Secondary | ICD-10-CM | POA: Diagnosis not present

## 2016-05-24 DIAGNOSIS — M2341 Loose body in knee, right knee: Secondary | ICD-10-CM | POA: Diagnosis not present

## 2016-05-24 DIAGNOSIS — M65861 Other synovitis and tenosynovitis, right lower leg: Secondary | ICD-10-CM | POA: Diagnosis not present

## 2016-05-24 DIAGNOSIS — M94261 Chondromalacia, right knee: Secondary | ICD-10-CM | POA: Diagnosis not present

## 2016-05-29 DIAGNOSIS — R531 Weakness: Secondary | ICD-10-CM | POA: Diagnosis not present

## 2016-05-29 DIAGNOSIS — R262 Difficulty in walking, not elsewhere classified: Secondary | ICD-10-CM | POA: Diagnosis not present

## 2016-05-29 DIAGNOSIS — M25461 Effusion, right knee: Secondary | ICD-10-CM | POA: Diagnosis not present

## 2016-05-29 DIAGNOSIS — M25661 Stiffness of right knee, not elsewhere classified: Secondary | ICD-10-CM | POA: Diagnosis not present

## 2016-05-30 DIAGNOSIS — M25461 Effusion, right knee: Secondary | ICD-10-CM | POA: Diagnosis not present

## 2016-05-30 DIAGNOSIS — M2341 Loose body in knee, right knee: Secondary | ICD-10-CM | POA: Diagnosis not present

## 2016-05-30 DIAGNOSIS — M25361 Other instability, right knee: Secondary | ICD-10-CM | POA: Diagnosis not present

## 2016-06-06 DIAGNOSIS — M25461 Effusion, right knee: Secondary | ICD-10-CM | POA: Diagnosis not present

## 2016-06-06 DIAGNOSIS — R531 Weakness: Secondary | ICD-10-CM | POA: Diagnosis not present

## 2016-06-06 DIAGNOSIS — M25661 Stiffness of right knee, not elsewhere classified: Secondary | ICD-10-CM | POA: Diagnosis not present

## 2016-06-06 DIAGNOSIS — R262 Difficulty in walking, not elsewhere classified: Secondary | ICD-10-CM | POA: Diagnosis not present

## 2016-06-10 DIAGNOSIS — R262 Difficulty in walking, not elsewhere classified: Secondary | ICD-10-CM | POA: Diagnosis not present

## 2016-06-10 DIAGNOSIS — R531 Weakness: Secondary | ICD-10-CM | POA: Diagnosis not present

## 2016-06-10 DIAGNOSIS — M25461 Effusion, right knee: Secondary | ICD-10-CM | POA: Diagnosis not present

## 2016-06-10 DIAGNOSIS — M25661 Stiffness of right knee, not elsewhere classified: Secondary | ICD-10-CM | POA: Diagnosis not present

## 2016-06-13 DIAGNOSIS — M25661 Stiffness of right knee, not elsewhere classified: Secondary | ICD-10-CM | POA: Diagnosis not present

## 2016-06-13 DIAGNOSIS — M25461 Effusion, right knee: Secondary | ICD-10-CM | POA: Diagnosis not present

## 2016-06-13 DIAGNOSIS — R262 Difficulty in walking, not elsewhere classified: Secondary | ICD-10-CM | POA: Diagnosis not present

## 2016-06-13 DIAGNOSIS — R531 Weakness: Secondary | ICD-10-CM | POA: Diagnosis not present

## 2016-06-17 DIAGNOSIS — R531 Weakness: Secondary | ICD-10-CM | POA: Diagnosis not present

## 2016-06-17 DIAGNOSIS — M25461 Effusion, right knee: Secondary | ICD-10-CM | POA: Diagnosis not present

## 2016-06-17 DIAGNOSIS — M25661 Stiffness of right knee, not elsewhere classified: Secondary | ICD-10-CM | POA: Diagnosis not present

## 2016-06-17 DIAGNOSIS — R262 Difficulty in walking, not elsewhere classified: Secondary | ICD-10-CM | POA: Diagnosis not present

## 2016-06-19 DIAGNOSIS — M25661 Stiffness of right knee, not elsewhere classified: Secondary | ICD-10-CM | POA: Diagnosis not present

## 2016-06-19 DIAGNOSIS — M25461 Effusion, right knee: Secondary | ICD-10-CM | POA: Diagnosis not present

## 2016-06-19 DIAGNOSIS — R531 Weakness: Secondary | ICD-10-CM | POA: Diagnosis not present

## 2016-06-19 DIAGNOSIS — R262 Difficulty in walking, not elsewhere classified: Secondary | ICD-10-CM | POA: Diagnosis not present

## 2016-06-27 DIAGNOSIS — M25461 Effusion, right knee: Secondary | ICD-10-CM | POA: Diagnosis not present

## 2016-06-27 DIAGNOSIS — R531 Weakness: Secondary | ICD-10-CM | POA: Diagnosis not present

## 2016-06-27 DIAGNOSIS — R262 Difficulty in walking, not elsewhere classified: Secondary | ICD-10-CM | POA: Diagnosis not present

## 2016-06-27 DIAGNOSIS — M25661 Stiffness of right knee, not elsewhere classified: Secondary | ICD-10-CM | POA: Diagnosis not present

## 2016-07-03 DIAGNOSIS — M25461 Effusion, right knee: Secondary | ICD-10-CM | POA: Diagnosis not present

## 2016-07-03 DIAGNOSIS — R262 Difficulty in walking, not elsewhere classified: Secondary | ICD-10-CM | POA: Diagnosis not present

## 2016-07-03 DIAGNOSIS — M25661 Stiffness of right knee, not elsewhere classified: Secondary | ICD-10-CM | POA: Diagnosis not present

## 2016-07-03 DIAGNOSIS — R531 Weakness: Secondary | ICD-10-CM | POA: Diagnosis not present

## 2016-07-08 MED FILL — ELINEST-28 TABLET: 0.3-30 | 84 days supply | Qty: 84 | Fill #1

## 2016-07-10 DIAGNOSIS — R531 Weakness: Secondary | ICD-10-CM | POA: Diagnosis not present

## 2016-07-10 DIAGNOSIS — R262 Difficulty in walking, not elsewhere classified: Secondary | ICD-10-CM | POA: Diagnosis not present

## 2016-07-10 DIAGNOSIS — M25661 Stiffness of right knee, not elsewhere classified: Secondary | ICD-10-CM | POA: Diagnosis not present

## 2016-07-10 DIAGNOSIS — M25461 Effusion, right knee: Secondary | ICD-10-CM | POA: Diagnosis not present

## 2016-07-31 DIAGNOSIS — R531 Weakness: Secondary | ICD-10-CM | POA: Diagnosis not present

## 2016-07-31 DIAGNOSIS — M25461 Effusion, right knee: Secondary | ICD-10-CM | POA: Diagnosis not present

## 2016-07-31 DIAGNOSIS — R262 Difficulty in walking, not elsewhere classified: Secondary | ICD-10-CM | POA: Diagnosis not present

## 2016-07-31 DIAGNOSIS — M25661 Stiffness of right knee, not elsewhere classified: Secondary | ICD-10-CM | POA: Diagnosis not present

## 2016-09-12 DIAGNOSIS — M2341 Loose body in knee, right knee: Secondary | ICD-10-CM | POA: Diagnosis not present

## 2016-09-12 DIAGNOSIS — M25361 Other instability, right knee: Secondary | ICD-10-CM | POA: Diagnosis not present

## 2016-10-01 MED FILL — ELINEST-28 TABLET: 0.3-30 | 84 days supply | Qty: 84 | Fill #2

## 2016-10-11 DIAGNOSIS — M5441 Lumbago with sciatica, right side: Secondary | ICD-10-CM | POA: Diagnosis not present

## 2016-10-11 DIAGNOSIS — M531 Cervicobrachial syndrome: Secondary | ICD-10-CM | POA: Diagnosis not present

## 2016-10-11 DIAGNOSIS — M9904 Segmental and somatic dysfunction of sacral region: Secondary | ICD-10-CM | POA: Diagnosis not present

## 2016-10-11 DIAGNOSIS — M9903 Segmental and somatic dysfunction of lumbar region: Secondary | ICD-10-CM | POA: Diagnosis not present

## 2016-10-11 DIAGNOSIS — M791 Myalgia: Secondary | ICD-10-CM | POA: Diagnosis not present

## 2016-10-11 DIAGNOSIS — M9901 Segmental and somatic dysfunction of cervical region: Secondary | ICD-10-CM | POA: Diagnosis not present

## 2016-10-11 DIAGNOSIS — M9902 Segmental and somatic dysfunction of thoracic region: Secondary | ICD-10-CM | POA: Diagnosis not present

## 2016-10-15 DIAGNOSIS — M1711 Unilateral primary osteoarthritis, right knee: Secondary | ICD-10-CM | POA: Diagnosis not present

## 2016-10-24 DIAGNOSIS — M1711 Unilateral primary osteoarthritis, right knee: Secondary | ICD-10-CM | POA: Diagnosis not present

## 2016-10-31 DIAGNOSIS — M1711 Unilateral primary osteoarthritis, right knee: Secondary | ICD-10-CM | POA: Diagnosis not present

## 2016-11-09 DIAGNOSIS — M5441 Lumbago with sciatica, right side: Secondary | ICD-10-CM | POA: Diagnosis not present

## 2016-11-09 DIAGNOSIS — M791 Myalgia: Secondary | ICD-10-CM | POA: Diagnosis not present

## 2016-11-09 DIAGNOSIS — M9901 Segmental and somatic dysfunction of cervical region: Secondary | ICD-10-CM | POA: Diagnosis not present

## 2016-11-09 DIAGNOSIS — M9904 Segmental and somatic dysfunction of sacral region: Secondary | ICD-10-CM | POA: Diagnosis not present

## 2016-11-09 DIAGNOSIS — M9902 Segmental and somatic dysfunction of thoracic region: Secondary | ICD-10-CM | POA: Diagnosis not present

## 2016-11-09 DIAGNOSIS — M531 Cervicobrachial syndrome: Secondary | ICD-10-CM | POA: Diagnosis not present

## 2016-11-09 DIAGNOSIS — M9903 Segmental and somatic dysfunction of lumbar region: Secondary | ICD-10-CM | POA: Diagnosis not present

## 2016-12-05 DIAGNOSIS — M9901 Segmental and somatic dysfunction of cervical region: Secondary | ICD-10-CM | POA: Diagnosis not present

## 2016-12-05 DIAGNOSIS — M9904 Segmental and somatic dysfunction of sacral region: Secondary | ICD-10-CM | POA: Diagnosis not present

## 2016-12-05 DIAGNOSIS — M9902 Segmental and somatic dysfunction of thoracic region: Secondary | ICD-10-CM | POA: Diagnosis not present

## 2016-12-05 DIAGNOSIS — M791 Myalgia: Secondary | ICD-10-CM | POA: Diagnosis not present

## 2016-12-05 DIAGNOSIS — M5441 Lumbago with sciatica, right side: Secondary | ICD-10-CM | POA: Diagnosis not present

## 2016-12-05 DIAGNOSIS — M531 Cervicobrachial syndrome: Secondary | ICD-10-CM | POA: Diagnosis not present

## 2016-12-05 DIAGNOSIS — M9903 Segmental and somatic dysfunction of lumbar region: Secondary | ICD-10-CM | POA: Diagnosis not present

## 2016-12-12 DIAGNOSIS — M25361 Other instability, right knee: Secondary | ICD-10-CM | POA: Diagnosis not present

## 2016-12-12 DIAGNOSIS — M2341 Loose body in knee, right knee: Secondary | ICD-10-CM | POA: Diagnosis not present

## 2017-01-07 DIAGNOSIS — M5441 Lumbago with sciatica, right side: Secondary | ICD-10-CM | POA: Diagnosis not present

## 2017-01-07 DIAGNOSIS — M9903 Segmental and somatic dysfunction of lumbar region: Secondary | ICD-10-CM | POA: Diagnosis not present

## 2017-01-07 DIAGNOSIS — M9902 Segmental and somatic dysfunction of thoracic region: Secondary | ICD-10-CM | POA: Diagnosis not present

## 2017-01-07 DIAGNOSIS — M791 Myalgia: Secondary | ICD-10-CM | POA: Diagnosis not present

## 2017-01-07 DIAGNOSIS — M531 Cervicobrachial syndrome: Secondary | ICD-10-CM | POA: Diagnosis not present

## 2017-01-07 DIAGNOSIS — M9904 Segmental and somatic dysfunction of sacral region: Secondary | ICD-10-CM | POA: Diagnosis not present

## 2017-01-07 DIAGNOSIS — M9901 Segmental and somatic dysfunction of cervical region: Secondary | ICD-10-CM | POA: Diagnosis not present

## 2017-01-12 DIAGNOSIS — J069 Acute upper respiratory infection, unspecified: Secondary | ICD-10-CM | POA: Diagnosis not present

## 2017-01-12 DIAGNOSIS — J029 Acute pharyngitis, unspecified: Secondary | ICD-10-CM | POA: Diagnosis not present

## 2017-01-17 DIAGNOSIS — J309 Allergic rhinitis, unspecified: Secondary | ICD-10-CM | POA: Diagnosis not present

## 2017-01-17 DIAGNOSIS — J45909 Unspecified asthma, uncomplicated: Secondary | ICD-10-CM | POA: Diagnosis not present

## 2017-01-17 MED FILL — VENTOLIN HFA 90 MCG INHALER: 108 (90 BAS | 50 days supply | Qty: 18 | Fill #0

## 2017-01-17 MED FILL — FLUTICASONE PROP 50 MCG SPR: 50 | 30 days supply | Qty: 16 | Fill #0

## 2017-02-07 DIAGNOSIS — M9904 Segmental and somatic dysfunction of sacral region: Secondary | ICD-10-CM | POA: Diagnosis not present

## 2017-02-07 DIAGNOSIS — M5441 Lumbago with sciatica, right side: Secondary | ICD-10-CM | POA: Diagnosis not present

## 2017-02-07 DIAGNOSIS — M9903 Segmental and somatic dysfunction of lumbar region: Secondary | ICD-10-CM | POA: Diagnosis not present

## 2017-02-07 DIAGNOSIS — M531 Cervicobrachial syndrome: Secondary | ICD-10-CM | POA: Diagnosis not present

## 2017-02-07 DIAGNOSIS — M791 Myalgia: Secondary | ICD-10-CM | POA: Diagnosis not present

## 2017-02-07 DIAGNOSIS — M9902 Segmental and somatic dysfunction of thoracic region: Secondary | ICD-10-CM | POA: Diagnosis not present

## 2017-02-07 DIAGNOSIS — M9901 Segmental and somatic dysfunction of cervical region: Secondary | ICD-10-CM | POA: Diagnosis not present

## 2017-03-06 DIAGNOSIS — M9904 Segmental and somatic dysfunction of sacral region: Secondary | ICD-10-CM | POA: Diagnosis not present

## 2017-03-06 DIAGNOSIS — M9901 Segmental and somatic dysfunction of cervical region: Secondary | ICD-10-CM | POA: Diagnosis not present

## 2017-03-06 DIAGNOSIS — M5441 Lumbago with sciatica, right side: Secondary | ICD-10-CM | POA: Diagnosis not present

## 2017-03-06 DIAGNOSIS — M9903 Segmental and somatic dysfunction of lumbar region: Secondary | ICD-10-CM | POA: Diagnosis not present

## 2017-03-06 DIAGNOSIS — M9902 Segmental and somatic dysfunction of thoracic region: Secondary | ICD-10-CM | POA: Diagnosis not present

## 2017-03-06 DIAGNOSIS — M791 Myalgia: Secondary | ICD-10-CM | POA: Diagnosis not present

## 2017-03-06 DIAGNOSIS — M531 Cervicobrachial syndrome: Secondary | ICD-10-CM | POA: Diagnosis not present
